# Patient Record
Sex: Female | Born: 1985 | Race: Black or African American | Hispanic: No | Marital: Single | Smoking: Current every day smoker
Health system: Southern US, Community
[De-identification: ages and names within clinical notes are randomized; demographics above are authoritative.]

## PROBLEM LIST (undated history)

## (undated) DIAGNOSIS — F32A Depression, unspecified: Secondary | ICD-10-CM

## (undated) DIAGNOSIS — K5900 Constipation, unspecified: Secondary | ICD-10-CM

## (undated) DIAGNOSIS — F329 Major depressive disorder, single episode, unspecified: Secondary | ICD-10-CM

## (undated) DIAGNOSIS — N83209 Unspecified ovarian cyst, unspecified side: Secondary | ICD-10-CM

## (undated) DIAGNOSIS — F419 Anxiety disorder, unspecified: Secondary | ICD-10-CM

## (undated) HISTORY — DX: Anxiety disorder, unspecified: F41.9

## (undated) HISTORY — DX: Unspecified ovarian cyst, unspecified side: N83.209

## (undated) HISTORY — DX: Depression, unspecified: F32.A

## (undated) HISTORY — DX: Constipation, unspecified: K59.00

## (undated) HISTORY — PX: MANDIBLE FRACTURE SURGERY: SHX706

---
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
## 2011-06-08 ENCOUNTER — Emergency Department: Payer: Self-pay | Admitting: Emergency Medicine

## 2019-02-13 ENCOUNTER — Other Ambulatory Visit: Payer: Self-pay

## 2019-02-13 ENCOUNTER — Emergency Department
Admission: EM | Admit: 2019-02-13 | Discharge: 2019-02-13 | Disposition: A | Discharge status: Home / Self Care | Payer: Medicaid Other | Attending: Emergency Medicine | Admitting: Emergency Medicine

## 2019-02-13 ENCOUNTER — Encounter: Payer: Self-pay | Admitting: Emergency Medicine

## 2019-02-13 ENCOUNTER — Emergency Department: Payer: Medicaid Other

## 2019-02-13 DIAGNOSIS — J029 Acute pharyngitis, unspecified: Secondary | ICD-10-CM | POA: Diagnosis present

## 2019-02-13 DIAGNOSIS — N3000 Acute cystitis without hematuria: Secondary | ICD-10-CM | POA: Diagnosis not present

## 2019-02-13 DIAGNOSIS — J020 Streptococcal pharyngitis: Secondary | ICD-10-CM | POA: Insufficient documentation

## 2019-02-13 DIAGNOSIS — F17200 Nicotine dependence, unspecified, uncomplicated: Secondary | ICD-10-CM | POA: Diagnosis not present

## 2019-02-13 LAB — CBC
HCT: 34.8 % — ABNORMAL LOW (ref 36.0–46.0)
Hemoglobin: 10.9 g/dL — ABNORMAL LOW (ref 12.0–15.0)
MCH: 25.5 pg — ABNORMAL LOW (ref 26.0–34.0)
MCHC: 31.3 g/dL (ref 30.0–36.0)
MCV: 81.5 fL (ref 80.0–100.0)
PLATELETS: 308 10*3/uL (ref 150–400)
RBC: 4.27 MIL/uL (ref 3.87–5.11)
RDW: 17.4 % — ABNORMAL HIGH (ref 11.5–15.5)
WBC: 25.2 10*3/uL — ABNORMAL HIGH (ref 4.0–10.5)
nRBC: 0 % (ref 0.0–0.2)

## 2019-02-13 LAB — COMPREHENSIVE METABOLIC PANEL
ALT: 20 U/L (ref 0–44)
AST: 22 U/L (ref 15–41)
Albumin: 4 g/dL (ref 3.5–5.0)
Alkaline Phosphatase: 78 U/L (ref 38–126)
Anion gap: 9 (ref 5–15)
BUN: 7 mg/dL (ref 6–20)
CO2: 20 mmol/L — AB (ref 22–32)
Calcium: 9.1 mg/dL (ref 8.9–10.3)
Chloride: 108 mmol/L (ref 98–111)
Creatinine, Ser: 0.58 mg/dL (ref 0.44–1.00)
GFR calc Af Amer: >60 mL/min (ref >60)
GFR calc non Af Amer: >60 mL/min (ref >60)
Glucose, Bld: 114 mg/dL — ABNORMAL HIGH (ref 70–99)
Potassium: 3.7 mmol/L (ref 3.5–5.1)
SODIUM: 137 mmol/L (ref 135–145)
Total Bilirubin: 0.6 mg/dL (ref 0.3–1.2)
Total Protein: 8.2 g/dL — ABNORMAL HIGH (ref 6.5–8.1)

## 2019-02-13 LAB — URINALYSIS, COMPLETE (UACMP) WITH MICROSCOPIC
BILIRUBIN URINE: NEGATIVE
Glucose, UA: NEGATIVE mg/dL
Hgb urine dipstick: NEGATIVE
Ketones, ur: NEGATIVE mg/dL
Nitrite: NEGATIVE
Protein, ur: NEGATIVE mg/dL
Specific Gravity, Urine: 1.012 (ref 1.005–1.030)
pH: 6 (ref 5.0–8.0)

## 2019-02-13 LAB — GROUP A STREP BY PCR: Group A Strep by PCR: DETECTED — AB

## 2019-02-13 LAB — POCT PREGNANCY, URINE: Preg Test, Ur: NEGATIVE

## 2019-02-13 LAB — INFLUENZA PANEL BY PCR (TYPE A & B)
INFLAPCR: NEGATIVE
Influenza B By PCR: NEGATIVE

## 2019-02-13 MED ORDER — ACETAMINOPHEN 500 MG PO TABS
1000.0000 mg | ORAL_TABLET | Freq: Once | ORAL | Status: AC
  Administered 2019-02-13: 1000 mg via ORAL
  Filled 2019-02-13: qty 2

## 2019-02-13 MED ORDER — CEPHALEXIN 500 MG PO CAPS: 500.0000 mg | ORAL_CAPSULE | Freq: Four times a day (QID) | ORAL | 0 refills | Status: AC

## 2019-02-13 MED ORDER — MAGIC MOUTHWASH: 15.0000 mL | Freq: Three times a day (TID) | ORAL | 0 refills | Status: DC | PRN

## 2019-02-13 MED ORDER — LIDOCAINE VISCOUS HCL 2 % MT SOLN
15.0000 mL | Freq: Once | OROMUCOSAL | Status: AC
  Administered 2019-02-13: 15 mL via OROMUCOSAL
  Filled 2019-02-13: qty 15

## 2019-02-13 MED ORDER — PENICILLIN G BENZATHINE & PROC 1200000 UNIT/2ML IM SUSP
1.2000 10*6.[IU] | Freq: Once | INTRAMUSCULAR | Status: AC
  Administered 2019-02-13: 1.2 10*6.[IU] via INTRAMUSCULAR
  Filled 2019-02-13: qty 2

## 2019-02-13 MED ORDER — SODIUM CHLORIDE 0.9 % IV BOLUS
1000.0000 mL | Freq: Once | INTRAVENOUS | Status: AC
  Administered 2019-02-13: 1000 mL via INTRAVENOUS

## 2019-02-13 MED ORDER — ONDANSETRON 4 MG PO TBDP: 4.0000 mg | ORAL_TABLET | Freq: Three times a day (TID) | ORAL | 0 refills | Status: DC | PRN

## 2019-02-13 NOTE — ED Notes (Signed)
Pts parents arrived. Dr Sharma Covert cleared patient for visitors. Pts parents provided with masks and gowns and escorted to bedside.

## 2019-02-13 NOTE — ED Triage Notes (Signed)
Pt arrives via ems from with complaints of generalized body aches, fever, cough (for last 3 weeks), headache, and sore throat. Pt states her daughter was tested positive for the influenza "a month ago." Pt denies travel but reports she has been around residents of group home who had a cough. Cough reported to have started 3 weeks prior. However, sore throat, body aches, nausea, and headache started last night. Pt afebrile in triage

## 2019-02-13 NOTE — Discharge Instructions (Signed)
Please make sure you are washing your hands frequently and thoroughly to prevent the spread of infection.  Do not share drinks, or any cutlery.  Take the entire course of antibiotics for your urinary tract infection, even if you are feeling better.  You may continue to use Tylenol or Motrin for pain or fever.  Return to the emergency department if you develop severe pain, lightheadedness or fainting, inability to keep down fluids, or for any other symptoms concerning to you.

## 2019-02-13 NOTE — ED Provider Notes (Signed)
[facility] Emergency Department Provider Note  ____________________________________________  Time seen: Approximately 1:38 PM  I have reviewed the triage vital signs and the nursing notes.   HISTORY  Chief Complaint Generalized Body Aches; Sore Throat; and Headache    HPI Charlotte Hawkins is a 33 y.o. female, otherwise healthy, presenting with sore throat, nausea and vomiting, cough, fever, general malaise.  The patient reports that her young daughter tested positive for influenza several weeks ago and she did not have her influenza vaccination.  She says that for the last 3 weeks, she has been symptomatic with body aches, intermittent subjective fever that is not documented with thermometer, nonproductive cough and sore throat.  She did have an episode of nausea and vomiting that started last night.  Her symptoms have been worse over the last day.  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  History reviewed. No pertinent past medical history.  There are no active problems to display for this patient.   Past Surgical History:  Procedure Laterality Date  . MANDIBLE FRACTURE SURGERY      Current Outpatient Rx  . Order #: [id number] Class: Print  . Order #: [id number] Class: Print  . Order #: [id number] Class: Print    Allergies Patient has no allergy information on record.  No family history on file.  Social History Social History   Tobacco Use  . Smoking status: Current Every Day Smoker  . Smokeless tobacco: Never Used  Substance Use Topics  . Alcohol use: Yes  . Drug use: Not on file    Review of Systems Constitutional: Subjective fever and generalized body aches. Eyes: No visual changes.  No eye discharge. ENT: Positive sore throat. No congestion or rhinorrhea. Cardiovascular: Denies chest pain. Denies palpitations. Respiratory: Denies shortness of breath.  Positive  cough. Gastrointestinal: No abdominal pain.  Positive nausea, positive vomiting.  No diarrhea.  No constipation. Genitourinary: Negative for dysuria. Musculoskeletal: Negative for back pain. Skin: Negative for rash. Neurological: Negative for headaches. No focal numbness, tingling or weakness.     ____________________________________________   PHYSICAL EXAM:  VITAL SIGNS: ED Triage Vitals  Enc Vitals Group     BP 02/13/19 1037 135/72     Pulse Rate 02/13/19 1037 80     Resp 02/13/19 1037 (!) 22     Temp 02/13/19 1037 98.9 F (37.2 C)     Temp Source 02/13/19 1037 Oral     SpO2 02/13/19 1037 100 %     Weight 02/13/19 1038 175 lb (79.4 kg)     Height 02/13/19 1038 5' 2.5" (1.588 m)     Head Circumference --      Peak Flow --      Pain Score 02/13/19 1038 10     Pain Loc --      Pain Edu? --      Excl. in GC? --     Constitutional: Alert and oriented.  Answers questions appropriately.  Uncomfortable appearing and tearful. Eyes: Conjunctivae are normal.  EOMI. No scleral icterus.  No eye discharge. Head: Atraumatic. Nose: Clear rhinorrhea. Mouth/Throat: Mucous membranes are moist.  The patient has mild posterior pharyngeal erythema without tonsillar swelling or exudate.  The posterior palate is symmetric and the uvula is midline.  No hoarse voice, drooling, trismus or stridor. Neck: No stridor.  Supple.  No meningismus. Cardiovascular: Normal rate, regular rhythm. No murmurs, rubs or gallops.  Respiratory: Normal  respiratory effort.  No accessory muscle use or retractions. Lungs CTAB.  No wheezes, rales or ronchi. Gastrointestinal: Soft, nontender and nondistended.  No guarding or rebound.  No peritoneal signs. Musculoskeletal: No LE edema.  Neurologic:  A&Ox3.  Speech is clear.  Face and smile are symmetric.  EOMI.  Moves all extremities well. Skin:  Skin is warm, dry and intact. No rash noted. Psychiatric: Mood and affect are normal. Speech and behavior are normal.  Normal  judgement.  ____________________________________________   LABS (all labs ordered are listed, but only abnormal results are displayed)  Labs Reviewed  GROUP A STREP BY PCR - Abnormal; Notable for the following components:      Result Value   Group A Strep by PCR DETECTED (*)    All other components within normal limits  CBC - Abnormal; Notable for the following components:   WBC 25.2 (*)    Hemoglobin 10.9 (*)    HCT 34.8 (*)    MCH 25.5 (*)    RDW 17.4 (*)    All other components within normal limits  COMPREHENSIVE METABOLIC PANEL - Abnormal; Notable for the following components:   CO2 20 (*)    Glucose, Bld 114 (*)    Total Protein 8.2 (*)    All other components within normal limits  URINALYSIS, COMPLETE (UACMP) WITH MICROSCOPIC - Abnormal; Notable for the following components:   Color, Urine YELLOW (*)    APPearance HAZY (*)    Leukocytes,Ua SMALL (*)    Bacteria, UA RARE (*)    All other components within normal limits  INFLUENZA PANEL BY PCR (TYPE A & B)  POC URINE PREG, ED  POCT PREGNANCY, URINE   ____________________________________________  EKG  Not indicated ____________________________________________  RADIOLOGY  Dg Chest Portable 1 View  Result Date: 02/13/2019 CLINICAL DATA:  Cough EXAM: PORTABLE CHEST 1 VIEW COMPARISON:  None. FINDINGS: Lungs are clear. Heart size and pulmonary vascularity are normal. No adenopathy. No bone lesions. IMPRESSION: No edema or consolidation. Electronically Signed   By: Bretta Bang III M.D.   On: 02/13/2019 11:27    ____________________________________________   PROCEDURES  Procedure(s) performed: None  Procedures  Critical Care performed: Yes, see critical care note(s) ____________________________________________   INITIAL IMPRESSION / ASSESSMENT AND PLAN / ED COURSE  Pertinent labs & imaging results that were available during my care of the patient were reviewed by me and considered in my medical  decision making (see chart for details).  33 y.o., otherwise healthy, presenting with sore throat, fever, cough, nausea and vomiting; works in a group home facility.  Overall, the patient symptoms are concerning for influenza, strep as well given her pharyngeal erythema.  If her testing is negative, will consider coronavirus testing.  Strep test, influenza testing, and chest x-ray as well as basic laboratory studies have been ordered.  The patient will receive symptomatic treatment as well as intravenous fluids.  ----------------------------------------- 1:44 PM on 02/13/2019 -----------------------------------------  The patient's strep test did come back positive and she received intramuscular penicillin.  Her urinalysis does have some squamous cells, but has some evidence of UTI I will plan to treat her with a 5-day course of Keflex for this.  The patient's chest x-ray does not show any pneumonia.  At this time, coronavirus becomes much less likely due to the alternate diagnoses and she does not meet criteria for testing.  The patient is feeling significantly better at this time, and has been able to drink fluids without difficulty.  She will be discharged home.  Follow-up instructions as well as return precautions were discussed.   CRITICAL CARE Performed by: Rockne Menghini   Total critical care time: 30 minutes  Critical care time was exclusive of separately billable procedures and treating other patients.  Critical care was necessary to treat or prevent imminent or life-threatening deterioration.  Critical care was time spent personally by me on the following activities: development of treatment plan with patient and/or surrogate as well as nursing, discussions with consultants, evaluation of patient's response to treatment, examination of patient, obtaining history from patient or surrogate, ordering and performing treatments and interventions, ordering and review of laboratory  studies, ordering and review of radiographic studies, pulse oximetry and re-evaluation of patient's condition.   ____________________________________________  FINAL CLINICAL IMPRESSION(S) / ED DIAGNOSES  Final diagnoses:  Strep pharyngitis  Acute cystitis without hematuria         NEW MEDICATIONS STARTED DURING THIS VISIT:  New Prescriptions   CEPHALEXIN (KEFLEX) 500 MG CAPSULE    Take 1 capsule (500 mg total) by mouth 4 (four) times daily for 5 days.   MAGIC MOUTHWASH SOLN    Take 15 mLs by mouth 3 (three) times daily as needed for mouth pain (sore throat).   ONDANSETRON (ZOFRAN ODT) 4 MG DISINTEGRATING TABLET    Take 1 tablet (4 mg total) by mouth every 8 (eight) hours as needed.      Rockne Menghini, MD 02/13/19 1346

## 2019-07-14 ENCOUNTER — Emergency Department: Payer: Medicaid Other

## 2019-07-14 ENCOUNTER — Emergency Department
Admission: EM | Admit: 2019-07-14 | Discharge: 2019-07-14 | Disposition: A | Discharge status: Home / Self Care | Payer: Medicaid Other | Attending: Emergency Medicine | Admitting: Emergency Medicine

## 2019-07-14 ENCOUNTER — Other Ambulatory Visit: Payer: Self-pay

## 2019-07-14 DIAGNOSIS — R102 Pelvic and perineal pain: Secondary | ICD-10-CM | POA: Diagnosis present

## 2019-07-14 DIAGNOSIS — F17200 Nicotine dependence, unspecified, uncomplicated: Secondary | ICD-10-CM | POA: Diagnosis not present

## 2019-07-14 DIAGNOSIS — N83209 Unspecified ovarian cyst, unspecified side: Secondary | ICD-10-CM

## 2019-07-14 DIAGNOSIS — N83201 Unspecified ovarian cyst, right side: Secondary | ICD-10-CM | POA: Insufficient documentation

## 2019-07-14 LAB — CBC
HCT: 35.6 % — ABNORMAL LOW (ref 36.0–46.0)
Hemoglobin: 11 g/dL — ABNORMAL LOW (ref 12.0–15.0)
MCH: 24.6 pg — ABNORMAL LOW (ref 26.0–34.0)
MCHC: 30.9 g/dL (ref 30.0–36.0)
MCV: 79.5 fL — ABNORMAL LOW (ref 80.0–100.0)
Platelets: 394 10*3/uL (ref 150–400)
RBC: 4.48 MIL/uL (ref 3.87–5.11)
RDW: 17.7 % — ABNORMAL HIGH (ref 11.5–15.5)
WBC: 9.8 10*3/uL (ref 4.0–10.5)
nRBC: 0 % (ref 0.0–0.2)

## 2019-07-14 LAB — COMPREHENSIVE METABOLIC PANEL
ALT: 17 U/L (ref 0–44)
AST: 23 U/L (ref 15–41)
Albumin: 4.6 g/dL (ref 3.5–5.0)
Alkaline Phosphatase: 75 U/L (ref 38–126)
Anion gap: 10 (ref 5–15)
BUN: 5 mg/dL — ABNORMAL LOW (ref 6–20)
CO2: 19 mmol/L — ABNORMAL LOW (ref 22–32)
Calcium: 9.5 mg/dL (ref 8.9–10.3)
Chloride: 107 mmol/L (ref 98–111)
Creatinine, Ser: 0.66 mg/dL (ref 0.44–1.00)
GFR calc Af Amer: >60 mL/min (ref >60)
GFR calc non Af Amer: >60 mL/min (ref >60)
Glucose, Bld: 99 mg/dL (ref 70–99)
Potassium: 3.7 mmol/L (ref 3.5–5.1)
Sodium: 136 mmol/L (ref 135–145)
Total Bilirubin: 0.3 mg/dL (ref 0.3–1.2)
Total Protein: 8.6 g/dL — ABNORMAL HIGH (ref 6.5–8.1)

## 2019-07-14 MED ORDER — NAPROXEN 500 MG PO TABS: 500.0000 mg | ORAL_TABLET | Freq: Two times a day (BID) | ORAL | 0 refills | Status: AC

## 2019-07-14 MED ORDER — ONDANSETRON 4 MG PO TBDP: 4.0000 mg | ORAL_TABLET | Freq: Three times a day (TID) | ORAL | 0 refills | Status: DC | PRN

## 2019-07-14 MED ORDER — HYDROMORPHONE HCL 1 MG/ML IJ SOLN
1.0000 mg | Freq: Once | INTRAMUSCULAR | Status: AC
  Administered 2019-07-14: 1 mg via INTRAVENOUS

## 2019-07-14 MED ORDER — ONDANSETRON HCL 4 MG/2ML IJ SOLN
4.0000 mg | Freq: Once | INTRAMUSCULAR | Status: AC
  Administered 2019-07-14: 4 mg via INTRAVENOUS
  Filled 2019-07-14: qty 2

## 2019-07-14 MED ORDER — HYDROMORPHONE HCL 1 MG/ML IJ SOLN
1.0000 mg | Freq: Once | INTRAMUSCULAR | Status: AC
  Administered 2019-07-14: 05:00:00 1 mg via INTRAVENOUS
  Filled 2019-07-14: qty 1

## 2019-07-14 MED ORDER — SODIUM CHLORIDE 0.9 % IV BOLUS
1000.0000 mL | Freq: Once | INTRAVENOUS | Status: AC
  Administered 2019-07-14: 05:00:00 1000 mL via INTRAVENOUS

## 2019-07-14 MED ORDER — HYDROMORPHONE HCL 1 MG/ML IJ SOLN
INTRAMUSCULAR | Status: AC
  Filled 2019-07-14: qty 1

## 2019-07-14 NOTE — ED Triage Notes (Signed)
Pt states pelvic pain that radiates to left flank that began an hour ago. Pt states does have nausea. Pt appears uncomfortable.

## 2019-07-14 NOTE — ED Provider Notes (Signed)
[facility] Emergency Department Provider Note ____________________________________________   First MD Initiated Contact with Patient 07/14/19 [phone]     (approximate)  I have reviewed the triage vital signs and the nursing notes.   HISTORY  Chief Complaint Pelvic Pain    HPI Charlotte Hawkins is a 33 y.o. female with PMH as noted below as well as a history of ovarian cysts who presents with pelvic pain, acute onset around 1 hour ago, severe in intensity and now radiating to the right flank.  She reports associated nausea but no vomiting.  She denies any vaginal bleeding or discharge.  No past medical history on file.  There are no active problems to display for this patient.   Past Surgical History:  Procedure Laterality Date  . MANDIBLE FRACTURE SURGERY      Prior to Admission medications   Medication Sig Start Date End Date Taking? Authorizing Provider  magic mouthwash SOLN Take 15 mLs by mouth 3 (three) times daily as needed for mouth pain (sore throat). 02/13/19   Rockne MenghiniNorman, Anne-Caroline, MD  ondansetron (ZOFRAN ODT) 4 MG disintegrating tablet Take 1 tablet (4 mg total) by mouth every 8 (eight) hours as needed. 02/13/19   Rockne MenghiniNorman, Anne-Caroline, MD    Allergies Pepto-bismol [bismuth subsalicylate]  No family history on file.  Social History Social History   Tobacco Use  . Smoking status: Current Every Day Smoker  . Smokeless tobacco: Never Used  Substance Use Topics  . Alcohol use: Yes  . Drug use: Not on file    Review of Systems  Constitutional: No fever. Eyes: No redness. ENT: No sore throat. Cardiovascular: Denies chest pain. Respiratory: Denies shortness of breath. Gastrointestinal: No vomiting or diarrhea.  Genitourinary: Negative for vaginal bleeding or discharge.  Musculoskeletal: Negative for back pain. Skin: Negative for rash. Neurological: Negative for headache.   ____________________________________________   PHYSICAL  EXAM:  VITAL SIGNS: ED Triage Vitals  Enc Vitals Group     BP 07/14/19 0428 (!) 153/87     Pulse Rate 07/14/19 0428 (!) 118     Resp 07/14/19 0428 (!) 22     Temp 07/14/19 0428 98.1 F (36.7 C)     Temp Source 07/14/19 0428 Oral     SpO2 07/14/19 0428 100 %     Weight --      Height 07/14/19 0429 5\' 2"  (1.575 m)     Head Circumference --      Peak Flow --      Pain Score 07/14/19 0429 10     Pain Loc --      Pain Edu? --      Excl. in GC? --     Constitutional: Alert and oriented.  Very uncomfortable appearing. Eyes: Conjunctivae are normal.  No scleral icterus. Head: Atraumatic. Nose: No congestion/rhinnorhea. Mouth/Throat: Mucous membranes are moist.   Neck: Normal range of motion.  Cardiovascular: Good peripheral circulation. Respiratory: Normal respiratory effort.  No retractions.  Gastrointestinal: Soft with moderate suprapubic tenderness.  No distention.  Genitourinary: Normal external genitalia.  Moderate right adnexal tenderness. Musculoskeletal:  Extremities warm and well perfused.  Neurologic:  Normal speech and language. No gross focal neurologic deficits are appreciated.  Skin:  Skin is warm and dry. No rash noted. Psychiatric: Mood and affect are normal. Speech and behavior are normal.  ____________________________________________   LABS (all labs ordered are listed, but only abnormal results are displayed)  Labs Reviewed  CBC - Abnormal; Notable for the following components:  Result Value   Hemoglobin 11.0 (*)    HCT 35.6 (*)    MCV 79.5 (*)    MCH 24.6 (*)    RDW 17.7 (*)    All other components within normal limits  COMPREHENSIVE METABOLIC PANEL - Abnormal; Notable for the following components:   CO2 19 (*)    BUN 5 (*)    Total Protein 8.6 (*)    All other components within normal limits  URINALYSIS, COMPLETE (UACMP) WITH MICROSCOPIC  POC URINE PREG, ED   ____________________________________________  EKG    ____________________________________________  RADIOLOGY  US pelvis transvaginal: Right complex ovarian cyst likely hemorrhagic  ____________________________________________   PROCEDURES  Procedure(s) performed: No  Procedures  Critical Care performed: No ____________________________________________   INITIAL IMPRESSION / ASSESSMENT AND PLAN / ED COURSE  Pertinent labs & imaging results that were available during my care of the patient were reviewed by me and considered in my medical decision making (see chart for details).  33 year old female with PMH as noted above presents with acute onset of pelvic pain radiating to the right flank.  On exam, she is extremely uncomfortable appearing.  Abdomen is soft with moderate suprapubic tenderness and on pelvic exam there is no discharge but the patient does have significant right adnexal tenderness.  There is no palpable mass.  Overall presentation is most concerning for ruptured ovarian cyst versus possible torsion.  I consulted Dr. Amalia Hailey from OB/GYN who recommended that we proceed with ultrasound to differentiate between these etiologies and that he would not recommend any acute intervention until we see the ultrasound result.  The patient is somewhat more comfortable after 2 doses of Dilaudid.  We will obtain labs, ultrasound, and reassess.  ----------------------------------------- 7:09 AM on 07/14/2019 -----------------------------------------  Ultrasound shows a right ovarian cyst with septations and likely hemorrhagic.  There is no evidence of rupture and there is no significant free fluid in the pelvis.  On reassessment, the patient appears comfortable.  I would like to observe her for an additional 1 to 2 hours just to make sure that the severe pain she came in with does not return once the Dilaudid wears off.  If her pain remains under control she will be appropriate for discharge home.  I discussed the ultrasound results  with Dr. Amalia Hailey who recommends outpatient follow-up in the next 1 to 2 weeks.  I signed the patient out to the oncoming physician Dr. Charna Archer.  ____________________________________________   FINAL CLINICAL IMPRESSION(S) / ED DIAGNOSES  Final diagnoses:  Pelvic pain      NEW MEDICATIONS STARTED DURING THIS VISIT:  New Prescriptions   No medications on file     Note:  This document was prepared using Dragon voice recognition software and may include unintentional dictation errors.    Arta Silence, MD 07/14/19 [phone]

## 2019-07-14 NOTE — ED Provider Notes (Signed)
-----------------------------------------   7:05 AM on 07/14/2019 -----------------------------------------  Blood pressure 109/63, pulse 75, temperature 98.1 F (36.7 C), temperature source Oral, resp. rate 20, height 5\' 2"  (1.575 m), last menstrual period 06/03/2019, SpO2 98 %.  Assuming care from Dr. Cherylann Banas.  In short, Charlotte Hawkins is a 33 y.o. female with a chief complaint of Pelvic Pain .  Refer to the original H&P for additional details.  The current plan of care is to reevaluate to determine appropriate pain control.  Patient with hemorrhagic cyst and no evidence of torsion per ultrasound, discussed with OB by previous provider and plan for outpatient follow-up.  Patient reports she is "feeling much better".  Will have patient follow-up with OB/GYN as previously arranged, provided with pain medication and Zofran to use as needed.  Counseled to return to the ED for new or worsening symptoms, patient agrees with plan.    Blake Divine, MD 07/14/19 [phone]

## 2019-07-17 ENCOUNTER — Other Ambulatory Visit: Payer: Self-pay

## 2019-07-17 ENCOUNTER — Ambulatory Visit (INDEPENDENT_AMBULATORY_CARE_PROVIDER_SITE_OTHER): Payer: Medicaid Other | Admitting: Obstetrics and Gynecology

## 2019-07-17 ENCOUNTER — Encounter: Payer: Self-pay | Admitting: Obstetrics and Gynecology

## 2019-07-17 VITALS — BP 116/74 | HR 73 | Ht 62.0 in | Wt 196.0 lb

## 2019-07-17 DIAGNOSIS — N83201 Unspecified ovarian cyst, right side: Secondary | ICD-10-CM

## 2019-07-17 DIAGNOSIS — R102 Pelvic and perineal pain: Secondary | ICD-10-CM | POA: Diagnosis not present

## 2019-07-17 DIAGNOSIS — Z3009 Encounter for other general counseling and advice on contraception: Secondary | ICD-10-CM | POA: Diagnosis not present

## 2019-07-17 NOTE — Progress Notes (Signed)
HPI:      [name] is a 33 y.o. [phone] [id number] who LMP was Patient's last menstrual period was 07/17/2019.  Subjective:   She presents today after being seen in the emergency department for pelvic pain.  She was found to have a right hemorrhagic ovarian cyst.  She is not currently using anything for birth control but would like some birth control. She describes a history of 4 pregnancies with all preterm births-all babies are living. She also complains of heavy painful menstrual periods that she would like controlled with birth control. Her pain has significantly improved since her ED visit.    Hx: The following portions of the patient's history were reviewed and updated as appropriate:             She  has a past medical history of Anxiety, Constipation, Depression, and Ovarian cyst. She does not have a problem list on file. She  has a past surgical history that includes Mandible fracture surgery. Her family history includes Heart disease in her mother. She  reports that she has been smoking. She has never used smokeless tobacco. She reports current alcohol use. She reports current drug use. Frequency: 3.00 times per week. Drug: Marijuana. She has a current medication list which includes the following prescription(s): docusate sodium, fluoxetine, gabapentin, naproxen, ondansetron, and multivitamin-prenatal. She is allergic to bee venom and pepto-bismol [bismuth subsalicylate].       Review of Systems:  Review of Systems  Constitutional: Denied constitutional symptoms, night sweats, recent illness, fatigue, fever, insomnia and weight loss.  Eyes: Denied eye symptoms, eye pain, photophobia, vision change and visual disturbance.  Ears/Nose/Throat/Neck: Denied ear, nose, throat or neck symptoms, hearing loss, nasal discharge, sinus congestion and sore throat.  Cardiovascular: Denied cardiovascular symptoms, arrhythmia, chest pain/pressure, edema, exercise intolerance, orthopnea and  palpitations.  Respiratory: Denied pulmonary symptoms, asthma, pleuritic pain, productive sputum, cough, dyspnea and wheezing.  Gastrointestinal: Denied, gastro-esophageal reflux, melena, nausea and vomiting.  Genitourinary: See HPI for additional information.  Musculoskeletal: Denied musculoskeletal symptoms, stiffness, swelling, muscle weakness and myalgia.  Dermatologic: Denied dermatology symptoms, rash and scar.  Neurologic: Denied neurology symptoms, dizziness, headache, neck pain and syncope.  Psychiatric: Denied psychiatric symptoms, anxiety and depression.  Endocrine: Denied endocrine symptoms including hot flashes and night sweats.   Meds:   Current Outpatient Medications on File Prior to Visit  Medication Sig Dispense Refill  . docusate sodium (COLACE) 100 MG capsule Take 100 mg by mouth every other day.    Marland Kitchen. FLUoxetine (PROZAC) 20 MG tablet Take 20 mg by mouth 2 (two) times daily.    Marland Kitchen. gabapentin (NEURONTIN) 300 MG capsule Take 300 mg by mouth at bedtime.     . naproxen (NAPROSYN) 500 MG tablet Take 1 tablet (500 mg total) by mouth 2 (two) times daily with a meal for 6 days. 12 tablet 0  . ondansetron (ZOFRAN ODT) 4 MG disintegrating tablet Take 1 tablet (4 mg total) by mouth every 8 (eight) hours as needed for nausea or vomiting. 12 tablet 0  . Prenatal Vit-Fe Fumarate-FA (MULTIVITAMIN-PRENATAL) 27-0.8 MG TABS tablet Take 1 tablet by mouth daily at 12 noon.     No current facility-administered medications on file prior to visit.     Objective:     Vitals:   07/17/19 0940  BP: 116/74  Pulse: 73              Ultrasound findings and ED visit reviewed in detail with the patient.  Assessment:  [id number] There are no active problems to display for this patient.    1. Pelvic pain in female   2. Cyst of right ovary   3. Birth control counseling     Pain is significantly improving daily.   Plan:            1.  Continued use of NSAID S for pain relief.  2.   Follow-up ultrasound in 6 weeks  3.  Patient desires birth control and cycle control.  We have discussed multiple birth control methods.  The risk and benefits of each method was discussed in detail.  Patient would like to present for IUD insertion with her next menstrual period.  Orders Orders Placed This Encounter  Procedures  . US PELVIS (TRANSABDOMINAL ONLY)    No orders of the defined types were placed in this encounter.     F/U  Return for She is to call at the start of next menses. I spent 32 minutes involved in the care of this patient of which greater than 50% was spent discussing ED visit, hemorrhagic versus simple cyst, cyst follow-up, causes of hemorrhagic cyst, multiple methods of birth control risks and benefits.  All questions answered.  Finis Bud, M.D. 07/17/2019 10:19 AM

## 2019-07-18 ENCOUNTER — Ambulatory Visit (INDEPENDENT_AMBULATORY_CARE_PROVIDER_SITE_OTHER): Payer: Medicaid Other | Admitting: Obstetrics and Gynecology

## 2019-07-18 ENCOUNTER — Encounter: Payer: Self-pay | Admitting: Certified Nurse Midwife

## 2019-07-18 ENCOUNTER — Encounter: Payer: Self-pay | Admitting: Obstetrics and Gynecology

## 2019-07-18 VITALS — BP 113/75 | HR 71 | Ht 62.0 in | Wt 195.9 lb

## 2019-07-18 DIAGNOSIS — Z30430 Encounter for insertion of intrauterine contraceptive device: Secondary | ICD-10-CM

## 2019-07-18 DIAGNOSIS — Z3202 Encounter for pregnancy test, result negative: Secondary | ICD-10-CM | POA: Diagnosis not present

## 2019-07-18 LAB — POCT URINE PREGNANCY: Preg Test, Ur: NEGATIVE

## 2019-07-18 NOTE — Progress Notes (Signed)
HPI:      [name] is a 33 y.o. [phone] who LMP was Patient's last menstrual period was 07/17/2019.  Subjective:   She presents today for IUD insertion.  She would like an IUD for birth control.  She was recently seen in the emergency department for small hemorrhagic ovarian cyst which will be followed up by ultrasound in 6 weeks.  She says she has tried OCPs before and she did not do well taking them. She she started her period yesterday.    Hx: The following portions of the patient's history were reviewed and updated as appropriate:             She  has a past medical history of Anxiety, Constipation, Depression, and Ovarian cyst. She does not have a problem list on file. She  has a past surgical history that includes Mandible fracture surgery. Her family history includes Heart disease in her mother. She  reports that she has been smoking. She has never used smokeless tobacco. She reports current alcohol use. She reports current drug use. Frequency: 3.00 times per week. Drug: Marijuana. She has a current medication list which includes the following prescription(s): docusate sodium, fluoxetine, gabapentin, naproxen, ondansetron, and multivitamin-prenatal. She is allergic to bee venom and pepto-bismol [bismuth subsalicylate].       Review of Systems:  Review of Systems  Constitutional: Denied constitutional symptoms, night sweats, recent illness, fatigue, fever, insomnia and weight loss.  Eyes: Denied eye symptoms, eye pain, photophobia, vision change and visual disturbance.  Ears/Nose/Throat/Neck: Denied ear, nose, throat or neck symptoms, hearing loss, nasal discharge, sinus congestion and sore throat.  Cardiovascular: Denied cardiovascular symptoms, arrhythmia, chest pain/pressure, edema, exercise intolerance, orthopnea and palpitations.  Respiratory: Denied pulmonary symptoms, asthma, pleuritic pain, productive sputum, cough, dyspnea and wheezing.  Gastrointestinal: Denied,  gastro-esophageal reflux, melena, nausea and vomiting.  Genitourinary: Denied genitourinary symptoms including symptomatic vaginal discharge, pelvic relaxation issues, and urinary complaints.  Musculoskeletal: Denied musculoskeletal symptoms, stiffness, swelling, muscle weakness and myalgia.  Dermatologic: Denied dermatology symptoms, rash and scar.  Neurologic: Denied neurology symptoms, dizziness, headache, neck pain and syncope.  Psychiatric: Denied psychiatric symptoms, anxiety and depression.  Endocrine: Denied endocrine symptoms including hot flashes and night sweats.   Meds:   Current Outpatient Medications on File Prior to Visit  Medication Sig Dispense Refill  . docusate sodium (COLACE) 100 MG capsule Take 100 mg by mouth every other day.    Marland Kitchen. FLUoxetine (PROZAC) 20 MG tablet Take 20 mg by mouth 2 (two) times daily.    Marland Kitchen. gabapentin (NEURONTIN) 300 MG capsule Take 300 mg by mouth at bedtime.     . naproxen (NAPROSYN) 500 MG tablet Take 1 tablet (500 mg total) by mouth 2 (two) times daily with a meal for 6 days. 12 tablet 0  . ondansetron (ZOFRAN ODT) 4 MG disintegrating tablet Take 1 tablet (4 mg total) by mouth every 8 (eight) hours as needed for nausea or vomiting. 12 tablet 0  . Prenatal Vit-Fe Fumarate-FA (MULTIVITAMIN-PRENATAL) 27-0.8 MG TABS tablet Take 1 tablet by mouth daily at 12 noon.     No current facility-administered medications on file prior to visit.     Objective:     Vitals:   07/18/19 1516  BP: 113/75  Pulse: 71    Physical examination   Pelvic:   Vulva: Normal appearance.  No lesions.  Vagina: No lesions or abnormalities noted.  Support: Normal pelvic support.  Urethra No masses tenderness or scarring.  Meatus  Normal size without lesions or prolapse.  Cervix: Normal appearance.  No lesions.  Anus: Normal exam.  No lesions.  Perineum: Normal exam.  No lesions.        Bimanual   Uterus: Normal size.  Non-tender.  Mobile.  AV.  Adnexae: No masses.   Non-tender to palpation.  Cul-de-sac: Negative for abnormality.   IUD Procedure Pt has read the booklet and signed the appropriate forms regarding the Mirena IUD.  All of her questions have been answered.   The cervix was cleansed with betadine solution.  After sounding the uterus and noting the position, the IUD was placed in the usual manner without problem.  The string was cut to the appropriate length.  The patient tolerated the procedure well.             Assessment:    [id number] There are no active problems to display for this patient.    1. Encounter for insertion of mirena IUD       Plan:             F/U  Return in about 4 weeks (around 08/15/2019) for For IUD f/u.  Finis Bud, M.D. 07/18/2019 3:54 PM

## 2019-07-20 ENCOUNTER — Telehealth: Payer: Self-pay | Admitting: Obstetrics and Gynecology

## 2019-07-20 NOTE — Telephone Encounter (Signed)
Patient stated that she is having pain on the right side. She is taking aleve for pain. I let the patient that Dr. Amalia Hailey is not in the office until Tuesday. I told her she can try heating pad to help with the cramping. I have scheduled her to come sin next week.

## 2019-07-20 NOTE — Telephone Encounter (Signed)
Pt called and stated that she needs to speak with a nurse as soon as possible in regards to her having a severe amount of pain on one side after her appointment with Dr. Amalia Hailey this week. Please advise.

## 2019-07-22 ENCOUNTER — Emergency Department: Payer: Medicaid Other

## 2019-07-22 ENCOUNTER — Emergency Department
Admission: EM | Admit: 2019-07-22 | Discharge: 2019-07-22 | Disposition: A | Discharge status: Home / Self Care | Payer: Medicaid Other | Attending: Emergency Medicine | Admitting: Emergency Medicine

## 2019-07-22 ENCOUNTER — Other Ambulatory Visit: Payer: Self-pay

## 2019-07-22 DIAGNOSIS — N83201 Unspecified ovarian cyst, right side: Secondary | ICD-10-CM | POA: Insufficient documentation

## 2019-07-22 DIAGNOSIS — R1031 Right lower quadrant pain: Secondary | ICD-10-CM | POA: Diagnosis present

## 2019-07-22 DIAGNOSIS — Z79899 Other long term (current) drug therapy: Secondary | ICD-10-CM | POA: Diagnosis not present

## 2019-07-22 DIAGNOSIS — F17200 Nicotine dependence, unspecified, uncomplicated: Secondary | ICD-10-CM | POA: Insufficient documentation

## 2019-07-22 LAB — URINALYSIS, COMPLETE (UACMP) WITH MICROSCOPIC
Bacteria, UA: NONE SEEN
Bilirubin Urine: NEGATIVE
Glucose, UA: NEGATIVE mg/dL
Ketones, ur: NEGATIVE mg/dL
Nitrite: NEGATIVE
Protein, ur: NEGATIVE mg/dL
RBC / HPF: >50 RBC/hpf — ABNORMAL HIGH (ref 0–5)
Specific Gravity, Urine: 1.016 (ref 1.005–1.030)
Squamous Epithelial / HPF: NONE SEEN (ref 0–5)
pH: 5 (ref 5.0–8.0)

## 2019-07-22 LAB — COMPREHENSIVE METABOLIC PANEL
ALT: 16 U/L (ref 0–44)
AST: 22 U/L (ref 15–41)
Albumin: 3.9 g/dL (ref 3.5–5.0)
Alkaline Phosphatase: 72 U/L (ref 38–126)
Anion gap: 9 (ref 5–15)
BUN: 5 mg/dL — ABNORMAL LOW (ref 6–20)
CO2: 22 mmol/L (ref 22–32)
Calcium: 8.7 mg/dL — ABNORMAL LOW (ref 8.9–10.3)
Chloride: 105 mmol/L (ref 98–111)
Creatinine, Ser: 0.72 mg/dL (ref 0.44–1.00)
GFR calc Af Amer: >60 mL/min (ref >60)
GFR calc non Af Amer: >60 mL/min (ref >60)
Glucose, Bld: 86 mg/dL (ref 70–99)
Potassium: 4.1 mmol/L (ref 3.5–5.1)
Sodium: 136 mmol/L (ref 135–145)
Total Bilirubin: 0.3 mg/dL (ref 0.3–1.2)
Total Protein: 7.2 g/dL (ref 6.5–8.1)

## 2019-07-22 LAB — CBC
HCT: 34.2 % — ABNORMAL LOW (ref 36.0–46.0)
Hemoglobin: 10.2 g/dL — ABNORMAL LOW (ref 12.0–15.0)
MCH: 24.3 pg — ABNORMAL LOW (ref 26.0–34.0)
MCHC: 29.8 g/dL — ABNORMAL LOW (ref 30.0–36.0)
MCV: 81.4 fL (ref 80.0–100.0)
Platelets: 332 10*3/uL (ref 150–400)
RBC: 4.2 MIL/uL (ref 3.87–5.11)
RDW: 17.7 % — ABNORMAL HIGH (ref 11.5–15.5)
WBC: 8.4 10*3/uL (ref 4.0–10.5)
nRBC: 0 % (ref 0.0–0.2)

## 2019-07-22 LAB — LIPASE, BLOOD: Lipase: 18 U/L (ref 11–51)

## 2019-07-22 LAB — PREGNANCY, URINE: Preg Test, Ur: NEGATIVE

## 2019-07-22 MED ORDER — TRAMADOL HCL 50 MG PO TABS: 50.0000 mg | ORAL_TABLET | Freq: Four times a day (QID) | ORAL | 0 refills | Status: AC | PRN

## 2019-07-22 MED ORDER — IOHEXOL 240 MG/ML SOLN
50.0000 mL | Freq: Once | INTRAMUSCULAR | Status: AC | PRN
  Administered 2019-07-22: 50 mL via ORAL

## 2019-07-22 MED ORDER — IOHEXOL 300 MG/ML  SOLN
100.0000 mL | Freq: Once | INTRAMUSCULAR | Status: AC | PRN
  Administered 2019-07-22: 19:00:00 100 mL via INTRAVENOUS

## 2019-07-22 MED ORDER — ONDANSETRON HCL 4 MG/2ML IJ SOLN
4.0000 mg | Freq: Once | INTRAMUSCULAR | Status: AC
  Administered 2019-07-22: 4 mg via INTRAVENOUS
  Filled 2019-07-22: qty 2

## 2019-07-22 MED ORDER — MORPHINE SULFATE (PF) 4 MG/ML IV SOLN
4.0000 mg | Freq: Once | INTRAVENOUS | Status: AC
  Administered 2019-07-22: 4 mg via INTRAVENOUS
  Filled 2019-07-22: qty 1

## 2019-07-22 NOTE — ED Notes (Signed)
Pt taken to CT via stretcher.

## 2019-07-22 NOTE — ED Notes (Signed)
Lab notified to add on pregnancy test. 

## 2019-07-22 NOTE — ED Provider Notes (Signed)
[facility] Emergency Department Provider Note ____________________________________________   First MD Initiated Contact with Patient 07/22/19 1703     (approximate)  I have reviewed the triage vital signs and the nursing notes.   HISTORY  Chief Complaint Abdominal Pain    HPI Charlotte Hawkins is a 33 y.o. female with PMH as noted below including a recent diagnosis of an ovarian cyst who presents with persistent right lower abdominal/pelvic pain over the last week.  The patient was diagnosed with a hemorrhagic ovarian cyst and subsequently followed up with OB/GYN.  IUD was placed several days ago.  The patient states that the pain had significantly improved after her ER and OB/GYN visit, but then worsened again over the last 2 days.  She reports nausea but no vomiting.  She has no fever.  She denies any vaginal discharge or dysuria, but has had some bleeding since the IUD was placed.  Past Medical History:  Diagnosis Date  . Anxiety   . Constipation   . Depression   . Ovarian cyst     There are no active problems to display for this patient.   Past Surgical History:  Procedure Laterality Date  . MANDIBLE FRACTURE SURGERY      Prior to Admission medications   Medication Sig Start Date End Date Taking? Authorizing Provider  docusate sodium (COLACE) 100 MG capsule Take 100 mg by mouth every other day.    [provider]  FLUoxetine (PROZAC) 20 MG tablet Take 20 mg by mouth 2 (two) times daily. 10/19/18   [provider]  gabapentin (NEURONTIN) 300 MG capsule Take 300 mg by mouth at bedtime.  12/31/15   [provider]  ondansetron (ZOFRAN ODT) 4 MG disintegrating tablet Take 1 tablet (4 mg total) by mouth every 8 (eight) hours as needed for nausea or vomiting. 07/14/19   Chesley NoonJessup, Charles, MD  Prenatal Vit-Fe Fumarate-FA (MULTIVITAMIN-PRENATAL) 27-0.8 MG TABS tablet Take 1 tablet by mouth daily at 12 noon.    [provider]   traMADol (ULTRAM) 50 MG tablet Take 1 tablet (50 mg total) by mouth every 6 (six) hours as needed for up to 5 days for severe pain. 07/22/19 07/27/19  Dionne BucySiadecki, Lanyah Spengler, MD    Allergies Bee venom and Pepto-bismol [bismuth subsalicylate]  Family History  Problem Relation Age of Onset  . Heart disease Mother   . Breast cancer Neg Hx   . Ovarian cancer Neg Hx   . Colon cancer Neg Hx   . Diabetes Neg Hx     Social History Social History   Tobacco Use  . Smoking status: Current Every Day Smoker  . Smokeless tobacco: Never Used  Substance Use Topics  . Alcohol use: Yes    Comment: rare  . Drug use: Yes    Frequency: 3.0 times per week    Types: Marijuana    Review of Systems  Constitutional: No fever. Eyes: No redness. ENT: No sore throat. Cardiovascular: Denies chest pain. Respiratory: Denies shortness of breath. Gastrointestinal: Positive for nausea Genitourinary: Negative for dysuria.   Musculoskeletal: Negative for back pain. Skin: Negative for rash. Neurological: Negative for headache.   ____________________________________________   PHYSICAL EXAM:  VITAL SIGNS: ED Triage Vitals  Enc Vitals Group     BP 07/22/19 1551 (!) 109/52     Pulse Rate 07/22/19 1551 68     Resp 07/22/19 1551 14     Temp 07/22/19 1551 98.6 F (37 C)     Temp  Source 07/22/19 1551 Oral     SpO2 07/22/19 1551 98 %     Weight 07/22/19 1552 193 lb (87.5 kg)     Height 07/22/19 1707 5\' 2"  (1.575 m)     Head Circumference --      Peak Flow --      Pain Score 07/22/19 1551 10     Pain Loc --      Pain Edu? --      Excl. in GC? --     Constitutional: Alert and oriented.  Slightly uncomfortable appearing but in no acute distress. Eyes: Conjunctivae are normal.  Head: Atraumatic. Nose: No congestion/rhinnorhea. Mouth/Throat: Mucous membranes are moist.   Neck: Normal range of motion.  Cardiovascular: Good peripheral circulation. Respiratory: Normal respiratory effort.  No  retractions.  Gastrointestinal: Soft with moderate right lower quadrant tenderness.  No distention.  Genitourinary: No flank tenderness. Musculoskeletal: Extremities warm and well perfused.  Neurologic:  Normal speech and language. No gross focal neurologic deficits are appreciated.  Skin:  Skin is warm and dry. No rash noted. Psychiatric: Mood and affect are normal. Speech and behavior are normal.  ____________________________________________   LABS (all labs ordered are listed, but only abnormal results are displayed)  Labs Reviewed  COMPREHENSIVE METABOLIC PANEL - Abnormal; Notable for the following components:      Result Value   BUN 5 (*)    Calcium 8.7 (*)    All other components within normal limits  CBC - Abnormal; Notable for the following components:   Hemoglobin 10.2 (*)    HCT 34.2 (*)    MCH 24.3 (*)    MCHC 29.8 (*)    RDW 17.7 (*)    All other components within normal limits  URINALYSIS, COMPLETE (UACMP) WITH MICROSCOPIC - Abnormal; Notable for the following components:   Color, Urine YELLOW (*)    APPearance CLEAR (*)    Hgb urine dipstick LARGE (*)    Leukocytes,Ua TRACE (*)    RBC / HPF >50 (*)    All other components within normal limits  LIPASE, BLOOD  PREGNANCY, URINE   ____________________________________________  EKG   ____________________________________________  RADIOLOGY  CT abdomen: Right ovarian cyst similar appearance to prior ultrasound, with normal appendix and no other acute abnormality  ____________________________________________   PROCEDURES  Procedure(s) performed: No  Procedures  Critical Care performed: No ____________________________________________   INITIAL IMPRESSION / ASSESSMENT AND PLAN / ED COURSE  Pertinent labs & imaging results that were available during my care of the patient were reviewed by me and considered in my medical decision making (see chart for details).  33 year old female with PMH as noted  above presents with persistent right lower abdominal pain over the last week.  I reviewed the past medical records in epic.  I actually saw this patient in the ED on 07/14/2019 at which time she was diagnosed by ultrasound with a hemorrhagic right ovarian cyst.  She subsequently followed up with Dr. Logan BoresEvans from OB/GYN on 8/18 and 07/18/2019 and had an IUD placed.  On exam today the patient is uncomfortable but overall relatively well-appearing.  Her vital signs are normal.  Abdomen soft but she does have right lower quadrant tenderness.  Overall I suspect most likely persistent pain related to the hemorrhagic ovarian cyst as she states that the pain has been in the same location the whole time.  However given that she has been initiated on birth control and initially had improvement but then worsening, I would like  to broaden the work-up outside of gynecologic etiologies.  I will obtain a CT to rule out appendicitis, colitis, significant free fluid, or other acute abnormality.  If there are no significant findings I will discuss with OB/GYN about further management.  ----------------------------------------- 11:11 PM on 07/22/2019 -----------------------------------------  CT abdomen shows no acute abnormality.  I consulted Dr. Marcelline Mates from OB/GYN who recommended that the patient follow-up with Dr. Amalia Hailey this week, but advised that there was no intervention needed for now other than pain control.  The patient has an appointment with Dr. Ellard Artis set for Wednesday of this week.  On reassessment, she was comfortable appearing.  Her vital signs remained stable.  I counseled the patient on the results of the work-up.  There is no evidence of acute complication related to the ovarian cyst, and no evidence of other acute intra-abdominal process.  I advised that we would give some additional pain medication and recommended that she follow-up with Dr. Amalia Hailey this week as planned.  Return precautions given, and the  patient expressed understanding. ____________________________________________   FINAL CLINICAL IMPRESSION(S) / ED DIAGNOSES  Final diagnoses:  Cyst of right ovary      NEW MEDICATIONS STARTED DURING THIS VISIT:  Discharge Medication List as of 07/22/2019  9:29 PM    START taking these medications   Details  traMADol (ULTRAM) 50 MG tablet Take 1 tablet (50 mg total) by mouth every 6 (six) hours as needed for up to 5 days for severe pain., Starting Sun 07/22/2019, Until Fri 07/27/2019, Normal         Note:  This document was prepared using Dragon voice recognition software and may include unintentional dictation errors.    Arta Silence, MD 07/22/19 2325

## 2019-07-22 NOTE — ED Notes (Signed)
CT notified that pt is finished with oral contrast. 

## 2019-07-22 NOTE — ED Triage Notes (Signed)
Pt presents via POV c/o RLQ pain x1 weeks. Seen here last Saturday and d/c. Reports same symptoms but worsening. Pt reports following-up with OBGYN as instructed.

## 2019-07-22 NOTE — ED Notes (Signed)
Peripheral IV discontinued. Catheter intact. No signs of infiltration or redness. Gauze applied to IV site.   Discharge instructions reviewed with patient. Questions fielded by this RN. Patient verbalizes understanding of instructions. Patient discharged home in stable condition per siadecki. No acute distress noted at time of discharge.   Pt wheeled to parking lot for mother pu

## 2019-07-22 NOTE — ED Notes (Signed)
Pt c/o RLQ abd pain x 1 week, pt states she was here recently for the same pain, had [place] and followed up with GYN re: ovary. Pt had mirena IUD placed recently as well. Pt states since then the pain has increased and is constant. PT is tearful. Reports nausea and has been taking zofran at home this week.  Pt reports some vaginal bleeding as well.

## 2019-07-22 NOTE — Discharge Instructions (Addendum)
Continue Aleve or Naprosyn for the pain, with the tramadol prescribed today as needed for more severe pain.  Follow-up with Dr. Amalia Hailey on Wednesday as scheduled.  Return to the ER for new, worsening, or persistent severe abdominal pain, vomiting, fever, weakness, worsening vaginal bleeding, or any other new or worsening symptoms that concern you.

## 2019-07-22 NOTE — ED Notes (Signed)
Pt ambulatory to toilet with steady gait noted.  

## 2019-07-24 NOTE — Telephone Encounter (Signed)
Coronavirus (COVID-19) Are you at risk?  Are you at risk for the Coronavirus (COVID-19)?  To be considered HIGH RISK for Coronavirus (COVID-19), you have to meet the following criteria:  . Traveled to [place], [place], [place], [place] or [place]; or in the [place] to [place], [place], [place], or [place]; and have fever, cough, and shortness of breath within the last 2 weeks of travel OR . Been in close contact with a person diagnosed with COVID-19 within the last 2 weeks and have fever, cough, and shortness of breath . IF YOU DO NOT MEET THESE CRITERIA, YOU ARE CONSIDERED LOW RISK FOR COVID-19.  What to do if you are HIGH RISK for COVID-19?  . If you are having a medical emergency, call 911. . Seek medical care right away. Before you go to a doctor's office, urgent care or emergency department, call ahead and tell them about your recent travel, contact with someone diagnosed with COVID-19, and your symptoms. You should receive instructions from your physician's office regarding next steps of care.  . When you arrive at healthcare provider, tell the healthcare staff immediately you have returned from visiting [place], [place], [place], [place] or [place]; or traveled in the [place] to [place], [place], [place], or [place]; in the last two weeks or you have been in close contact with a person diagnosed with COVID-19 in the last 2 weeks.   . Tell the health care staff about your symptoms: fever, cough and shortness of breath. . After you have been seen by a medical provider, you will be either: o Tested for (COVID-19) and discharged home on quarantine except to seek medical care if symptoms worsen, and asked to  - Stay home and avoid contact with others until you get your results (4-5 days)  - Avoid travel on public transportation if possible (such as bus, train, or airplane) or o Sent to the Emergency Department by EMS for evaluation, COVID-19 testing, and possible  admission depending on your condition and test results.  What to do if you are LOW RISK for COVID-19?  Reduce your risk of any infection by using the same precautions used for avoiding the common cold or flu:  . Wash your hands often with soap and warm water for at least 20 seconds.  If soap and water are not readily available, use an alcohol-based hand sanitizer with at least 60% alcohol.  . If coughing or sneezing, cover your mouth and nose by coughing or sneezing into the elbow areas of your shirt or coat, into a tissue or into your sleeve (not your hands). . Avoid shaking hands with others and consider head nods or verbal greetings only. . Avoid touching your eyes, nose, or mouth with unwashed hands.  . Avoid close contact with people who are [place]. . Avoid places or events with large numbers of people in one location, like concerts or sporting events. . Carefully consider travel plans you have or are making. . If you are planning any travel outside or inside the [place], visit the CDC's Travelers' Health webpage for the latest health notices. . If you have some symptoms but not all symptoms, continue to monitor at home and seek medical attention if your symptoms worsen. . If you are having a medical emergency, call 911.  07/24/19 SCREENING NEG SLS ADDITIONAL HEALTHCARE OPTIONS FOR PATIENTS  [facility] / e-Visit: [email or web address]         [facility]: [phone]    [facility]: [phone]                   [facility]: [phone]  

## 2019-07-25 ENCOUNTER — Ambulatory Visit: Payer: Medicaid Other | Admitting: Obstetrics and Gynecology

## 2019-07-26 ENCOUNTER — Other Ambulatory Visit: Payer: Self-pay

## 2019-07-26 ENCOUNTER — Encounter: Payer: Self-pay | Admitting: Obstetrics and Gynecology

## 2019-07-26 ENCOUNTER — Ambulatory Visit (INDEPENDENT_AMBULATORY_CARE_PROVIDER_SITE_OTHER): Payer: Medicaid Other

## 2019-07-26 ENCOUNTER — Telehealth: Payer: Self-pay

## 2019-07-26 ENCOUNTER — Ambulatory Visit (INDEPENDENT_AMBULATORY_CARE_PROVIDER_SITE_OTHER): Payer: Medicaid Other | Admitting: Obstetrics and Gynecology

## 2019-07-26 VITALS — BP 118/77 | HR 86 | Wt 190.5 lb

## 2019-07-26 DIAGNOSIS — N83201 Unspecified ovarian cyst, right side: Secondary | ICD-10-CM

## 2019-07-26 DIAGNOSIS — R102 Pelvic and perineal pain: Secondary | ICD-10-CM | POA: Diagnosis not present

## 2019-07-26 NOTE — Telephone Encounter (Signed)
Pt is aware that DJE will be reviewing her U/S results and reaching out to her tomorrow. Pt stated that she was like to have surgery tomorrow if she needs to and is aware to not eat anything after midnight. Pt is expecting a call in the morning.

## 2019-07-26 NOTE — Progress Notes (Signed)
Patient comes in today for right pelvic pain.

## 2019-07-26 NOTE — Telephone Encounter (Signed)
Pt is aware that DJE will be reviewing her U/S results and reaching out to her tomorrow. Pt stated that she was like to have surgery tomorrow if she needs to and is aware to not eat anything after midnight.

## 2019-07-26 NOTE — Progress Notes (Signed)
HPI:      [name] is a 33 y.o. [phone] who LMP was Patient's last menstrual period was 07/17/2019.  Subjective:   She presents today stating that her right-sided pelvic pain was improving but then earlier this week it seemed to get worse again.  She states that she has now missed work because of the pain.  She wants to "make sure nothing is worse.". She reports no problems from her IUD.  Denies vaginal bleeding.    Hx: The following portions of the patient's history were reviewed and updated as appropriate:             She  has a past medical history of Anxiety, Constipation, Depression, and Ovarian cyst. She does not have a problem list on file. She  has a past surgical history that includes Mandible fracture surgery. Her family history includes Heart disease in her mother. She  reports that she has been smoking. She has never used smokeless tobacco. She reports current alcohol use. She reports current drug use. Frequency: 3.00 times per week. Drug: Marijuana. She has a current medication list which includes the following prescription(s): docusate sodium, fluoxetine, gabapentin, ondansetron, multivitamin-prenatal, and tramadol. She is allergic to bee venom and pepto-bismol [bismuth subsalicylate].       Review of Systems:  Review of Systems  Constitutional: Denied constitutional symptoms, night sweats, recent illness, fatigue, fever, insomnia and weight loss.  Eyes: Denied eye symptoms, eye pain, photophobia, vision change and visual disturbance.  Ears/Nose/Throat/Neck: Denied ear, nose, throat or neck symptoms, hearing loss, nasal discharge, sinus congestion and sore throat.  Cardiovascular: Denied cardiovascular symptoms, arrhythmia, chest pain/pressure, edema, exercise intolerance, orthopnea and palpitations.  Respiratory: Denied pulmonary symptoms, asthma, pleuritic pain, productive sputum, cough, dyspnea and wheezing.  Gastrointestinal: Denied, gastro-esophageal reflux,  melena, nausea and vomiting.  Genitourinary: See HPI for additional information.  Musculoskeletal: Denied musculoskeletal symptoms, stiffness, swelling, muscle weakness and myalgia.  Dermatologic: Denied dermatology symptoms, rash and scar.  Neurologic: Denied neurology symptoms, dizziness, headache, neck pain and syncope.  Psychiatric: Denied psychiatric symptoms, anxiety and depression.  Endocrine: Denied endocrine symptoms including hot flashes and night sweats.   Meds:   Current Outpatient Medications on File Prior to Visit  Medication Sig Dispense Refill  . docusate sodium (COLACE) 100 MG capsule Take 100 mg by mouth every other day.    Marland Kitchen FLUoxetine (PROZAC) 20 MG tablet Take 20 mg by mouth 2 (two) times daily.    Marland Kitchen gabapentin (NEURONTIN) 300 MG capsule Take 300 mg by mouth at bedtime.     . ondansetron (ZOFRAN ODT) 4 MG disintegrating tablet Take 1 tablet (4 mg total) by mouth every 8 (eight) hours as needed for nausea or vomiting. 12 tablet 0  . Prenatal Vit-Fe Fumarate-FA (MULTIVITAMIN-PRENATAL) 27-0.8 MG TABS tablet Take 1 tablet by mouth daily at 12 noon.    . traMADol (ULTRAM) 50 MG tablet Take 1 tablet (50 mg total) by mouth every 6 (six) hours as needed for up to 5 days for severe pain. 15 tablet 0   No current facility-administered medications on file prior to visit.     Objective:     Vitals:   07/26/19 1124  BP: 118/77  Pulse: 86                Assessment:    [id number] There are no active problems to display for this patient.    1. Pelvic pain in female   2. Cyst of right ovary  Patient seems to be having worsening pelvic pain.  She has a known right-sided hemorrhagic cyst.   Plan:            1.  Ultrasound follow-up of right ovarian cyst to make sure it is not enlarging and that rupture has not occurred. Orders Orders Placed This Encounter  Procedures  . US PELVIS (TRANSABDOMINAL ONLY)    No orders of the defined types were placed in this  encounter.     F/U  Return for We will contact her with any abnormal test results. I spent 17 minutes involved in the care of this patient of which greater than 50% was spent discussing ovarian cyst, complications of cyst, diagnostic methods including ultrasound, use of NSAIDs for pain  Elonda Huskyavid J. Kindel Rochefort, M.D. 07/26/2019 12:02 PM

## 2019-07-27 ENCOUNTER — Encounter: Payer: Self-pay | Admitting: Emergency Medicine

## 2019-07-27 ENCOUNTER — Ambulatory Visit (INDEPENDENT_AMBULATORY_CARE_PROVIDER_SITE_OTHER): Payer: Medicaid Other | Admitting: Obstetrics and Gynecology

## 2019-07-27 ENCOUNTER — Encounter: Payer: Self-pay | Admitting: Obstetrics and Gynecology

## 2019-07-27 ENCOUNTER — Encounter
Admission: RE | Disposition: A | Discharge status: Home / Self Care | Payer: Self-pay | Source: Ambulatory Visit | Attending: Obstetrics and Gynecology

## 2019-07-27 ENCOUNTER — Encounter: Payer: Self-pay | Admitting: Anesthesiology

## 2019-07-27 ENCOUNTER — Other Ambulatory Visit
Admission: RE | Admit: 2019-07-27 | Discharge: 2019-07-27 | Disposition: A | Discharge status: Home / Self Care | Payer: Medicaid Other | Source: Ambulatory Visit | Attending: Obstetrics and Gynecology | Admitting: Obstetrics and Gynecology

## 2019-07-27 ENCOUNTER — Ambulatory Visit
Admission: RE | Admit: 2019-07-27 | Discharge: 2019-07-27 | Disposition: A | Discharge status: Home / Self Care | Payer: Medicaid Other | Source: Ambulatory Visit | Attending: Obstetrics and Gynecology | Admitting: Obstetrics and Gynecology

## 2019-07-27 VITALS — BP 144/84 | HR 89 | Ht 62.0 in | Wt 191.8 lb

## 2019-07-27 DIAGNOSIS — Z5309 Procedure and treatment not carried out because of other contraindication: Secondary | ICD-10-CM | POA: Diagnosis not present

## 2019-07-27 DIAGNOSIS — F17200 Nicotine dependence, unspecified, uncomplicated: Secondary | ICD-10-CM | POA: Insufficient documentation

## 2019-07-27 DIAGNOSIS — N83201 Unspecified ovarian cyst, right side: Secondary | ICD-10-CM

## 2019-07-27 DIAGNOSIS — F419 Anxiety disorder, unspecified: Secondary | ICD-10-CM | POA: Diagnosis not present

## 2019-07-27 DIAGNOSIS — Z20828 Contact with and (suspected) exposure to other viral communicable diseases: Secondary | ICD-10-CM | POA: Diagnosis not present

## 2019-07-27 DIAGNOSIS — R102 Pelvic and perineal pain: Secondary | ICD-10-CM

## 2019-07-27 LAB — TYPE AND SCREEN
ABO/RH(D): O POS
Antibody Screen: NEGATIVE

## 2019-07-27 LAB — CBC
HCT: 32 % — ABNORMAL LOW (ref 36.0–46.0)
Hemoglobin: 9.5 g/dL — ABNORMAL LOW (ref 12.0–15.0)
MCH: 24.1 pg — ABNORMAL LOW (ref 26.0–34.0)
MCHC: 29.7 g/dL — ABNORMAL LOW (ref 30.0–36.0)
MCV: 81 fL (ref 80.0–100.0)
Platelets: 333 10*3/uL (ref 150–400)
RBC: 3.95 MIL/uL (ref 3.87–5.11)
RDW: 17.5 % — ABNORMAL HIGH (ref 11.5–15.5)
WBC: 9.2 10*3/uL (ref 4.0–10.5)
nRBC: 0 % (ref 0.0–0.2)

## 2019-07-27 LAB — URINE DRUG SCREEN, QUALITATIVE (ARMC ONLY)
Amphetamines, Ur Screen: NOT DETECTED
Barbiturates, Ur Screen: NOT DETECTED
Benzodiazepine, Ur Scrn: NOT DETECTED
Cannabinoid 50 Ng, Ur ~~LOC~~: POSITIVE — AB
Cocaine Metabolite,Ur ~~LOC~~: POSITIVE — AB
MDMA (Ecstasy)Ur Screen: NOT DETECTED
Methadone Scn, Ur: NOT DETECTED
Opiate, Ur Screen: POSITIVE — AB
Phencyclidine (PCP) Ur S: NOT DETECTED
Tricyclic, Ur Screen: NOT DETECTED

## 2019-07-27 LAB — SARS CORONAVIRUS 2 BY RT PCR (HOSPITAL ORDER, PERFORMED IN ~~LOC~~ HOSPITAL LAB): SARS Coronavirus 2: NEGATIVE

## 2019-07-27 LAB — POCT URINE PREGNANCY: Preg Test, Ur: NEGATIVE

## 2019-07-27 SURGERY — LAPAROSCOPY, DIAGNOSTIC
Anesthesia: General | Laterality: Right

## 2019-07-27 MED ORDER — FAMOTIDINE 20 MG PO TABS
20.0000 mg | ORAL_TABLET | Freq: Once | ORAL | Status: AC
  Administered 2019-07-27: 12:00:00 20 mg via ORAL

## 2019-07-27 MED ORDER — LACTATED RINGERS IV SOLN: INTRAVENOUS | Status: DC

## 2019-07-27 MED ORDER — FAMOTIDINE 20 MG PO TABS
ORAL_TABLET | ORAL | Status: AC
  Administered 2019-07-27: 20 mg via ORAL
  Filled 2019-07-27: qty 1

## 2019-07-27 MED ORDER — BUPIVACAINE HCL (PF) 0.5 % IJ SOLN
INTRAMUSCULAR | Status: AC
  Filled 2019-07-27: qty 30

## 2019-07-27 MED ORDER — PROPOFOL 10 MG/ML IV BOLUS
INTRAVENOUS | Status: AC
  Filled 2019-07-27: qty 20

## 2019-07-27 MED ORDER — FENTANYL CITRATE (PF) 100 MCG/2ML IJ SOLN
INTRAMUSCULAR | Status: AC
  Filled 2019-07-27: qty 2

## 2019-07-27 MED ORDER — LIDOCAINE HCL (PF) 2 % IJ SOLN
INTRAMUSCULAR | Status: AC
  Filled 2019-07-27: qty 10

## 2019-07-27 MED ORDER — ROCURONIUM BROMIDE 50 MG/5ML IV SOLN
INTRAVENOUS | Status: AC
  Filled 2019-07-27: qty 1

## 2019-07-27 MED ORDER — MIDAZOLAM HCL 2 MG/2ML IJ SOLN
INTRAMUSCULAR | Status: AC
  Filled 2019-07-27: qty 2

## 2019-07-27 SURGICAL SUPPLY — 42 items
BAG DECANTER FOR FLEXI CONT (MISCELLANEOUS) ×3 IMPLANT
BLADE SURG 15 STRL LF DISP TIS (BLADE) ×1 IMPLANT
BLADE SURG 15 STRL SS (BLADE) ×2
BLADE SURG SZ11 CARB STEEL (BLADE) ×3 IMPLANT
CANISTER SUCT 1200ML W/VALVE (MISCELLANEOUS) ×3 IMPLANT
CATH ROBINSON RED A/P 16FR (CATHETERS) ×3 IMPLANT
CHLORAPREP W/TINT 26 (MISCELLANEOUS) ×3 IMPLANT
CLOSURE WOUND 1/2 X4 (GAUZE/BANDAGES/DRESSINGS) ×1
COVER WAND RF STERILE (DRAPES) IMPLANT
ELECT REM PT RETURN 9FT ADLT (ELECTROSURGICAL) ×3
ELECTRODE REM PT RTRN 9FT ADLT (ELECTROSURGICAL) ×1 IMPLANT
GLOVE BIOGEL PI ORTHO PRO 7.5 (GLOVE) ×2
GLOVE PI ORTHO PRO STRL 7.5 (GLOVE) ×1 IMPLANT
GLOVE SURG SYN 8.0 (GLOVE) ×3 IMPLANT
GOWN STRL REUS W/ TWL LRG LVL3 (GOWN DISPOSABLE) ×2 IMPLANT
GOWN STRL REUS W/TWL LRG LVL3 (GOWN DISPOSABLE) ×4
GOWN STRL REUS W/TWL XL LVL4 (GOWN DISPOSABLE) ×3 IMPLANT
IRRIGATION STRYKERFLOW (MISCELLANEOUS) IMPLANT
IRRIGATOR STRYKERFLOW (MISCELLANEOUS)
IV LACTATED RINGERS 1000ML (IV SOLUTION) ×3 IMPLANT
KIT PINK PAD W/HEAD ARE REST (MISCELLANEOUS) ×3
KIT PINK PAD W/HEAD ARM REST (MISCELLANEOUS) ×1 IMPLANT
KIT TURNOVER CYSTO (KITS) ×3 IMPLANT
LIGASURE LAP MARYLAND 5MM 37CM (ELECTROSURGICAL) IMPLANT
NEEDLE HYPO 22GX1.5 SAFETY (NEEDLE) ×3 IMPLANT
NS IRRIG 500ML POUR BTL (IV SOLUTION) ×3 IMPLANT
PACK GYN LAPAROSCOPIC (MISCELLANEOUS) ×3 IMPLANT
PAD OB MATERNITY 4.3X12.25 (PERSONAL CARE ITEMS) ×3 IMPLANT
PAD PREP 24X41 OB/GYN DISP (PERSONAL CARE ITEMS) ×3 IMPLANT
SET TUBE SMOKE EVAC HIGH FLOW (TUBING) ×3 IMPLANT
SLEEVE ENDOPATH XCEL 5M (ENDOMECHANICALS) IMPLANT
STRIP CLOSURE SKIN 1/2X4 (GAUZE/BANDAGES/DRESSINGS) ×2 IMPLANT
SUT VICRYL 0 AB UR-6 (SUTURE) ×3 IMPLANT
SUT VICRYL 4-0  27 PS-2 BARIAT (SUTURE) ×2
SUT VICRYL 4-0 27 PS-2 BARIAT (SUTURE) ×1
SUTURE VICRYL 4-0 27 PS-2 BART (SUTURE) ×1 IMPLANT
TOWEL OR 17X26 4PK STRL BLUE (TOWEL DISPOSABLE) ×3 IMPLANT
TROCAR ENDO BLADELESS 11MM (ENDOMECHANICALS) IMPLANT
TROCAR XCEL NON-BLD 5MMX100MML (ENDOMECHANICALS) ×3 IMPLANT
TROCAR XCEL UNIV SLVE 11M 100M (ENDOMECHANICALS) IMPLANT
TUBING CONNECTING 10 (TUBING) ×2 IMPLANT
TUBING CONNECTING 10' (TUBING) ×1

## 2019-07-27 NOTE — H&P (View-Only) (Signed)
PRE-OPERATIVE HISTORY AND PHYSICAL EXAM  PCP:  Cl[facility], [facility] Subjective:   HPI:  Charlotte Hawkins is a 33 y.o. [phone].  Patient's last menstrual period was 07/17/2019.  She presents today for a pre-op discussion and PE.  She has the following symptoms: Worsening pelvic pain.  Ultrasound reveals no change in hemorrhagic cyst but appearance of a right fallopian tube dilation.  Patient desires surgery as soon as possible because of her pain.   Review of Systems:   Constitutional: Denied constitutional symptoms, night sweats, recent illness, fatigue, fever, insomnia and weight loss.  Eyes: Denied eye symptoms, eye pain, photophobia, vision change and visual disturbance.  Ears/Nose/Throat/Neck: Denied ear, nose, throat or neck symptoms, hearing loss, nasal discharge, sinus congestion and sore throat.  Cardiovascular: Denied cardiovascular symptoms, arrhythmia, chest pain/pressure, edema, exercise intolerance, orthopnea and palpitations.  Respiratory: Denied pulmonary symptoms, asthma, pleuritic pain, productive sputum, cough, dyspnea and wheezing.  Gastrointestinal: Denied, gastro-esophageal reflux, melena, nausea and vomiting.  Genitourinary:  Worsening pelvic/abdominal pain.  Musculoskeletal: Denied musculoskeletal symptoms, stiffness, swelling, muscle weakness and myalgia.  Dermatologic: Denied dermatology symptoms, rash and scar.  Neurologic: Denied neurology symptoms, dizziness, headache, neck pain and syncope.  Psychiatric: Denied psychiatric symptoms, anxiety and depression.  Endocrine: Denied endocrine symptoms including hot flashes and night sweats.   OB History  Gravida Para Term Preterm AB Living  4 4   4   4   SAB TAB Ectopic Multiple Live Births          4    # Outcome Date GA Lbr Len/2nd Weight Sex Delivery Anes PTL Lv  4 Preterm 2019    F Vag-Spont   LIV  3 Preterm 2014    M Vag-Spont   LIV  2 Preterm 2011    M Vag-Spont   LIV  1 Preterm 2007    M Vag-Spont    LIV    Past Medical History:  Diagnosis Date  . Anxiety   . Constipation   . Depression   . Ovarian cyst     Past Surgical History:  Procedure Laterality Date  . MANDIBLE FRACTURE SURGERY        SOCIAL HISTORY:  Social History   Tobacco Use  Smoking Status Current Every Day Smoker  Smokeless Tobacco Never Used   Social History   Substance and Sexual Activity  Alcohol Use Yes   Comment: rare   Substance and Sexual Activity  Alcohol Use Yes   Comment: rare     Substance and Sexual Activity  Drug Use Yes  . Frequency: 3.0 times per week  . Types: Marijuana    Family History  Problem Relation Age of Onset  . Heart disease Mother   . Breast cancer Neg Hx   . Ovarian cancer Neg Hx   . Colon cancer Neg Hx   . Diabetes Neg Hx     ALLERGIES:  Bee venom and Pepto-bismol [bismuth subsalicylate]  MEDS:   Current Outpatient Medications on File Prior to Visit  Medication Sig Dispense Refill  . docusate sodium (COLACE) 100 MG capsule Take 100 mg by mouth every other day.    Marland Kitchen FLUoxetine (PROZAC) 20 MG tablet Take 20 mg by mouth 2 (two) times daily.    Marland Kitchen gabapentin (NEURONTIN) 300 MG capsule Take 300 mg by mouth at bedtime.     . ondansetron (ZOFRAN ODT) 4 MG disintegrating tablet Take 1 tablet (4 mg total) by mouth every 8 (eight) hours as needed for  nausea or vomiting. 12 tablet 0  . Prenatal Vit-Fe Fumarate-FA (MULTIVITAMIN-PRENATAL) 27-0.8 MG TABS tablet Take 1 tablet by mouth daily at 12 noon.    . traMADol (ULTRAM) 50 MG tablet Take 1 tablet (50 mg total) by mouth every 6 (six) hours as needed for up to 5 days for severe pain. 15 tablet 0   No current facility-administered medications on file prior to visit.     No orders of the defined types were placed in this encounter.    Physical examination BP (!) 144/84   Pulse 89   Ht 5\' 2"  (1.575 m)   Wt 191 lb 12.8 oz (87 kg)   LMP 07/17/2019   BMI 35.08 kg/m   General NAD, Conversant  HEENT Atraumatic;  Op clear with mmm.  Normo-cephalic. Pupils reactive. Anicteric sclerae  Thyroid/Neck Smooth without nodularity or enlargement. Normal ROM.  Neck Supple.  Skin No rashes, lesions or ulceration. Normal palpated skin turgor. No nodularity.  Breasts: No masses or discharge.  Symmetric.  No axillary adenopathy.  Lungs: Clear to auscultation.No rales or wheezes. Normal Respiratory effort, no retractions.  Heart: NSR.  No murmurs or rubs appreciated. No periferal edema  Abdomen: Soft.  Tender to palpation.  No masses.  No HSM. No hernia  Extremities: Moves all appropriately.  Normal ROM for age. No lymphadenopathy.  Neuro: Oriented to PPT.  Normal mood. Normal affect.   The uterus is anteverted and measures 8.7 x 4.1 x 5.7 cm.. Echo texture is homogenous without evidence of focal masses.  The Endometrium measures 6 mm. IUD seen cetraly located.  Right Ovary measures 3.5 x 2.7 x 2.5  cm. It is normal in appearance. Left Ovary measures 3.9 x 1.9 x 3.7  cm. It is normal in appearance. Survey of the adnexa demonstrates adjacent to the Lt ovary  a 4.4 x 2.8 x 2.3 cm complex heterogeneous structure with internal echoes. There is  free fluid in the cul de sac.   Impression: 1. Lt ovary appears normal with a heterogeneous complex structure with internal echoes just adjacent to it as described above.     Could be fallopian tube. 2. IUD was seen central located in the endometruim.  Assessment:   [id number] There are no active problems to display for this patient.   1. Pelvic pain in female   2. Cyst of right ovary    Worsening pelvic pain right hemorrhagic cyst right dilated fallopian tube   Plan:   Orders: No orders of the defined types were placed in this encounter.    1.  Exploratory laparoscopy, right salpingectomy possible right oophorectomy  Pre-op discussions regarding Risks and Benefits of her scheduled surgery.  Risks and benefits discussed in detail.  Possible future  infertility discussed, specific risks of anesthesia, blood loss, infection, damage to other internal organs discussed.  All questions answered.

## 2019-07-27 NOTE — Interval H&P Note (Signed)
History and Physical Interval Note:  07/27/2019 11:41 AM  Charlotte Hawkins  has presented today for surgery, with the diagnosis of Hemmorraghic Right Ovarian Cyst.  The various methods of treatment have been discussed with the patient and family. After consideration of risks, benefits and other options for treatment, the patient has consented to  Procedure(s): LAPAROSCOPY DIAGNOSTIC (Right) LAPAROSCOPIC UNILATERAL SALPINGECTOMY POSSIBLE OOPHORECTOMY (Right) as a surgical intervention.  The patient's history has been reviewed, patient examined, no change in status, stable for surgery.  I have reviewed the patient's chart and labs.  Questions were answered to the patient's satisfaction.     Jeannie Fend

## 2019-07-27 NOTE — Anesthesia Preprocedure Evaluation (Deleted)
Anesthesia Evaluation  Patient identified by MRN, date of birth, ID band Patient awake    Reviewed: Allergy & Precautions, H&P , NPO status , Patient's Chart, lab work & pertinent test results, reviewed documented beta blocker date and time   Airway Mallampati: II  TM Distance: >3 FB Neck ROM: full    Dental  (+) Teeth Intact   Pulmonary neg pulmonary ROS, Current Smoker and Patient abstained from smoking.,    Pulmonary exam normal        Cardiovascular Exercise Tolerance: Good negative cardio ROS Normal cardiovascular exam Rhythm:regular Rate:Normal     Neuro/Psych PSYCHIATRIC DISORDERS Anxiety Depression negative neurological ROS     GI/Hepatic negative GI ROS, Neg liver ROS,   Endo/Other  negative endocrine ROS  Renal/GU negative Renal ROS  negative genitourinary   Musculoskeletal   Abdominal   Peds  Hematology negative hematology ROS (+)   Anesthesia Other Findings Past Medical History: No date: Anxiety No date: Constipation No date: Depression No date: Ovarian cyst Past Surgical History: No date: MANDIBLE FRACTURE SURGERY BMI    Body Mass Index: 35.08 kg/m     Reproductive/Obstetrics negative OB ROS                             Anesthesia Physical Anesthesia Plan  ASA: II  Anesthesia Plan: General ETT   Post-op Pain Management:    Induction:   PONV Risk Score and Plan:   Airway Management Planned:   Additional Equipment:   Intra-op Plan:   Post-operative Plan:   Informed Consent: I have reviewed the patients History and Physical, chart, labs and discussed the procedure including the risks, benefits and alternatives for the proposed anesthesia with the patient or authorized representative who has indicated his/her understanding and acceptance.     Dental Advisory Given  Plan Discussed with: CRNA  Anesthesia Plan Comments:         Anesthesia Quick  Evaluation

## 2019-07-27 NOTE — Progress Notes (Signed)
Patient comes in today for pre op. She came in today in severe pain.

## 2019-07-27 NOTE — Addendum Note (Signed)
Addended by: Durwin Glaze on: 07/27/2019 11:03 AM   Modules accepted: Orders

## 2019-07-27 NOTE — Progress Notes (Signed)
PRE-OPERATIVE HISTORY AND PHYSICAL EXAM  PCP:  [facility] Subjective:   HPI:  Charlotte Hawkins is a 33 y.o. [phone].  Patient's last menstrual period was 07/17/2019.  She presents today for a pre-op discussion and PE.  She has the following symptoms: Worsening pelvic pain.  Ultrasound reveals no change in hemorrhagic cyst but appearance of a right fallopian tube dilation.  Patient desires surgery as soon as possible because of her pain.   Review of Systems:   Constitutional: Denied constitutional symptoms, night sweats, recent illness, fatigue, fever, insomnia and weight loss.  Eyes: Denied eye symptoms, eye pain, photophobia, vision change and visual disturbance.  Ears/Nose/Throat/Neck: Denied ear, nose, throat or neck symptoms, hearing loss, nasal discharge, sinus congestion and sore throat.  Cardiovascular: Denied cardiovascular symptoms, arrhythmia, chest pain/pressure, edema, exercise intolerance, orthopnea and palpitations.  Respiratory: Denied pulmonary symptoms, asthma, pleuritic pain, productive sputum, cough, dyspnea and wheezing.  Gastrointestinal: Denied, gastro-esophageal reflux, melena, nausea and vomiting.  Genitourinary:  Worsening pelvic/abdominal pain.  Musculoskeletal: Denied musculoskeletal symptoms, stiffness, swelling, muscle weakness and myalgia.  Dermatologic: Denied dermatology symptoms, rash and scar.  Neurologic: Denied neurology symptoms, dizziness, headache, neck pain and syncope.  Psychiatric: Denied psychiatric symptoms, anxiety and depression.  Endocrine: Denied endocrine symptoms including hot flashes and night sweats.   OB History  Gravida Para Term Preterm AB Living  4 4   4   4   SAB TAB Ectopic Multiple Live Births          4    # Outcome Date GA Lbr Len/2nd Weight Sex Delivery Anes PTL Lv  4 Preterm 2019    F Vag-Spont   LIV  3 Preterm 2014    M Vag-Spont   LIV  2 Preterm 2011    M Vag-Spont   LIV  1 Preterm 2007    M Vag-Spont    LIV    Past Medical History:  Diagnosis Date  . Anxiety   . Constipation   . Depression   . Ovarian cyst     Past Surgical History:  Procedure Laterality Date  . MANDIBLE FRACTURE SURGERY        SOCIAL HISTORY:  Social History   Tobacco Use  Smoking Status Current Every Day Smoker  Smokeless Tobacco Never Used   Social History   Substance and Sexual Activity  Alcohol Use Yes   Comment: rare   Substance and Sexual Activity  Alcohol Use Yes   Comment: rare     Substance and Sexual Activity  Drug Use Yes  . Frequency: 3.0 times per week  . Types: Marijuana    Family History  Problem Relation Age of Onset  . Heart disease Mother   . Breast cancer Neg Hx   . Ovarian cancer Neg Hx   . Colon cancer Neg Hx   . Diabetes Neg Hx     ALLERGIES:  Bee venom and Pepto-bismol [bismuth subsalicylate]  MEDS:   Current Outpatient Medications on File Prior to Visit  Medication Sig Dispense Refill  . docusate sodium (COLACE) 100 MG capsule Take 100 mg by mouth every other day.    Marland Kitchen FLUoxetine (PROZAC) 20 MG tablet Take 20 mg by mouth 2 (two) times daily.    Marland Kitchen gabapentin (NEURONTIN) 300 MG capsule Take 300 mg by mouth at bedtime.     . ondansetron (ZOFRAN ODT) 4 MG disintegrating tablet Take 1 tablet (4 mg total) by mouth every 8 (eight) hours as needed for  nausea or vomiting. 12 tablet 0  . Prenatal Vit-Fe Fumarate-FA (MULTIVITAMIN-PRENATAL) 27-0.8 MG TABS tablet Take 1 tablet by mouth daily at 12 noon.    . traMADol (ULTRAM) 50 MG tablet Take 1 tablet (50 mg total) by mouth every 6 (six) hours as needed for up to 5 days for severe pain. 15 tablet 0   No current facility-administered medications on file prior to visit.     No orders of the defined types were placed in this encounter.    Physical examination BP (!) 144/84   Pulse 89   Ht 5\' 2"  (1.575 m)   Wt 191 lb 12.8 oz (87 kg)   LMP 07/17/2019   BMI 35.08 kg/m   General NAD, Conversant  HEENT Atraumatic;  Op clear with mmm.  Normo-cephalic. Pupils reactive. Anicteric sclerae  Thyroid/Neck Smooth without nodularity or enlargement. Normal ROM.  Neck Supple.  Skin No rashes, lesions or ulceration. Normal palpated skin turgor. No nodularity.  Breasts: No masses or discharge.  Symmetric.  No axillary adenopathy.  Lungs: Clear to auscultation.No rales or wheezes. Normal Respiratory effort, no retractions.  Heart: NSR.  No murmurs or rubs appreciated. No periferal edema  Abdomen: Soft.  Tender to palpation.  No masses.  No HSM. No hernia  Extremities: Moves all appropriately.  Normal ROM for age. No lymphadenopathy.  Neuro: Oriented to PPT.  Normal mood. Normal affect.   The uterus is anteverted and measures 8.7 x 4.1 x 5.7 cm.. Echo texture is homogenous without evidence of focal masses.  The Endometrium measures 6 mm. IUD seen cetraly located.  Right Ovary measures 3.5 x 2.7 x 2.5  cm. It is normal in appearance. Left Ovary measures 3.9 x 1.9 x 3.7  cm. It is normal in appearance. Survey of the adnexa demonstrates adjacent to the Lt ovary  a 4.4 x 2.8 x 2.3 cm complex heterogeneous structure with internal echoes. There is  free fluid in the cul de sac.   Impression: 1. Lt ovary appears normal with a heterogeneous complex structure with internal echoes just adjacent to it as described above.     Could be fallopian tube. 2. IUD was seen central located in the endometruim.  Assessment:   [id number] There are no active problems to display for this patient.   1. Pelvic pain in female   2. Cyst of right ovary    Worsening pelvic pain right hemorrhagic cyst right dilated fallopian tube   Plan:   Orders: No orders of the defined types were placed in this encounter.    1.  Exploratory laparoscopy, right salpingectomy possible right oophorectomy  Pre-op discussions regarding Risks and Benefits of her scheduled surgery.  Risks and benefits discussed in detail.  Possible future  infertility discussed, specific risks of anesthesia, blood loss, infection, damage to other internal organs discussed.  All questions answered. I spent 35 minutes involved in the care of this patient of which greater than 50% was spent discussing pelvic pain, ovarian cyst, tubal dilation, surgical options, risks and benefits of surgery.  Elonda Huskyavid J. Evans, M.D. 07/27/2019 10:52 AM

## 2019-07-27 NOTE — H&P (Signed)
PRE-OPERATIVE HISTORY AND PHYSICAL EXAM  PCP:  [facility] Subjective:   HPI:  Charlotte Hawkins is a 33 y.o. [phone].  Patient's last menstrual period was 07/17/2019.  She presents today for a pre-op discussion and PE.  She has the following symptoms: Worsening pelvic pain.  Ultrasound reveals no change in hemorrhagic cyst but appearance of a right fallopian tube dilation.  Patient desires surgery as soon as possible because of her pain.   Review of Systems:   Constitutional: Denied constitutional symptoms, night sweats, recent illness, fatigue, fever, insomnia and weight loss.  Eyes: Denied eye symptoms, eye pain, photophobia, vision change and visual disturbance.  Ears/Nose/Throat/Neck: Denied ear, nose, throat or neck symptoms, hearing loss, nasal discharge, sinus congestion and sore throat.  Cardiovascular: Denied cardiovascular symptoms, arrhythmia, chest pain/pressure, edema, exercise intolerance, orthopnea and palpitations.  Respiratory: Denied pulmonary symptoms, asthma, pleuritic pain, productive sputum, cough, dyspnea and wheezing.  Gastrointestinal: Denied, gastro-esophageal reflux, melena, nausea and vomiting.  Genitourinary:  Worsening pelvic/abdominal pain.  Musculoskeletal: Denied musculoskeletal symptoms, stiffness, swelling, muscle weakness and myalgia.  Dermatologic: Denied dermatology symptoms, rash and scar.  Neurologic: Denied neurology symptoms, dizziness, headache, neck pain and syncope.  Psychiatric: Denied psychiatric symptoms, anxiety and depression.  Endocrine: Denied endocrine symptoms including hot flashes and night sweats.   OB History  Gravida Para Term Preterm AB Living  4 4   4   4   SAB TAB Ectopic Multiple Live Births          4    # Outcome Date GA Lbr Len/2nd Weight Sex Delivery Anes PTL Lv  4 Preterm 2019    F Vag-Spont   LIV  3 Preterm 2014    M Vag-Spont   LIV  2 Preterm 2011    M Vag-Spont   LIV  1 Preterm 2007    M Vag-Spont    LIV    Past Medical History:  Diagnosis Date  . Anxiety   . Constipation   . Depression   . Ovarian cyst     Past Surgical History:  Procedure Laterality Date  . MANDIBLE FRACTURE SURGERY        SOCIAL HISTORY:  Social History   Tobacco Use  Smoking Status Current Every Day Smoker  Smokeless Tobacco Never Used   Social History   Substance and Sexual Activity  Alcohol Use Yes   Comment: rare   Substance and Sexual Activity  Alcohol Use Yes   Comment: rare     Substance and Sexual Activity  Drug Use Yes  . Frequency: 3.0 times per week  . Types: Marijuana    Family History  Problem Relation Age of Onset  . Heart disease Mother   . Breast cancer Neg Hx   . Ovarian cancer Neg Hx   . Colon cancer Neg Hx   . Diabetes Neg Hx     ALLERGIES:  Bee venom and Pepto-bismol [bismuth subsalicylate]  MEDS:   Current Outpatient Medications on File Prior to Visit  Medication Sig Dispense Refill  . docusate sodium (COLACE) 100 MG capsule Take 100 mg by mouth every other day.    Marland Kitchen FLUoxetine (PROZAC) 20 MG tablet Take 20 mg by mouth 2 (two) times daily.    Marland Kitchen gabapentin (NEURONTIN) 300 MG capsule Take 300 mg by mouth at bedtime.     . ondansetron (ZOFRAN ODT) 4 MG disintegrating tablet Take 1 tablet (4 mg total) by mouth every 8 (eight) hours as needed for  nausea or vomiting. 12 tablet 0  . Prenatal Vit-Fe Fumarate-FA (MULTIVITAMIN-PRENATAL) 27-0.8 MG TABS tablet Take 1 tablet by mouth daily at 12 noon.    . traMADol (ULTRAM) 50 MG tablet Take 1 tablet (50 mg total) by mouth every 6 (six) hours as needed for up to 5 days for severe pain. 15 tablet 0   No current facility-administered medications on file prior to visit.     No orders of the defined types were placed in this encounter.    Physical examination BP (!) 144/84   Pulse 89   Ht 5\' 2"  (1.575 m)   Wt 191 lb 12.8 oz (87 kg)   LMP 07/17/2019   BMI 35.08 kg/m   General NAD, Conversant  HEENT Atraumatic;  Op clear with mmm.  Normo-cephalic. Pupils reactive. Anicteric sclerae  Thyroid/Neck Smooth without nodularity or enlargement. Normal ROM.  Neck Supple.  Skin No rashes, lesions or ulceration. Normal palpated skin turgor. No nodularity.  Breasts: No masses or discharge.  Symmetric.  No axillary adenopathy.  Lungs: Clear to auscultation.No rales or wheezes. Normal Respiratory effort, no retractions.  Heart: NSR.  No murmurs or rubs appreciated. No periferal edema  Abdomen: Soft.  Tender to palpation.  No masses.  No HSM. No hernia  Extremities: Moves all appropriately.  Normal ROM for age. No lymphadenopathy.  Neuro: Oriented to PPT.  Normal mood. Normal affect.   The uterus is anteverted and measures 8.7 x 4.1 x 5.7 cm.. Echo texture is homogenous without evidence of focal masses.  The Endometrium measures 6 mm. IUD seen cetraly located.  Right Ovary measures 3.5 x 2.7 x 2.5  cm. It is normal in appearance. Left Ovary measures 3.9 x 1.9 x 3.7  cm. It is normal in appearance. Survey of the adnexa demonstrates adjacent to the Lt ovary  a 4.4 x 2.8 x 2.3 cm complex heterogeneous structure with internal echoes. There is  free fluid in the cul de sac.   Impression: 1. Lt ovary appears normal with a heterogeneous complex structure with internal echoes just adjacent to it as described above.     Could be fallopian tube. 2. IUD was seen central located in the endometruim.  Assessment:   [id number] There are no active problems to display for this patient.   1. Pelvic pain in female   2. Cyst of right ovary    Worsening pelvic pain right hemorrhagic cyst right dilated fallopian tube   Plan:   Orders: No orders of the defined types were placed in this encounter.    1.  Exploratory laparoscopy, right salpingectomy possible right oophorectomy  Pre-op discussions regarding Risks and Benefits of her scheduled surgery.  Risks and benefits discussed in detail.  Possible future  infertility discussed, specific risks of anesthesia, blood loss, infection, damage to other internal organs discussed.  All questions answered.

## 2019-07-27 NOTE — Telephone Encounter (Signed)
Scheduled patient to come in this morning to see Dr. Amalia Hailey.

## 2019-07-27 NOTE — Progress Notes (Signed)
Patients UDS positive. Per Dr Amalia Hailey r/s for Monday.

## 2019-07-27 NOTE — Addendum Note (Signed)
Addended by: Durwin Glaze on: 07/27/2019 11:28 AM   Modules accepted: Orders

## 2019-07-28 ENCOUNTER — Encounter: Payer: Self-pay | Admitting: Anesthesiology

## 2019-07-30 ENCOUNTER — Other Ambulatory Visit: Payer: Self-pay

## 2019-07-30 ENCOUNTER — Ambulatory Visit: Payer: Medicaid Other | Admitting: Anesthesiology

## 2019-07-30 ENCOUNTER — Encounter: Payer: Self-pay | Admitting: Emergency Medicine

## 2019-07-30 ENCOUNTER — Encounter
Admission: RE | Disposition: A | Discharge status: Home / Self Care | Payer: Self-pay | Source: Home / Self Care | Attending: Obstetrics and Gynecology

## 2019-07-30 ENCOUNTER — Ambulatory Visit
Admission: RE | Admit: 2019-07-30 | Discharge: 2019-07-30 | Disposition: A | Discharge status: Home / Self Care | Payer: Medicaid Other | Attending: Obstetrics and Gynecology | Admitting: Obstetrics and Gynecology

## 2019-07-30 DIAGNOSIS — R102 Pelvic and perineal pain: Secondary | ICD-10-CM | POA: Insufficient documentation

## 2019-07-30 DIAGNOSIS — Z91030 Bee allergy status: Secondary | ICD-10-CM | POA: Insufficient documentation

## 2019-07-30 DIAGNOSIS — N7001 Acute salpingitis: Secondary | ICD-10-CM | POA: Insufficient documentation

## 2019-07-30 DIAGNOSIS — F17200 Nicotine dependence, unspecified, uncomplicated: Secondary | ICD-10-CM | POA: Diagnosis not present

## 2019-07-30 DIAGNOSIS — Z888 Allergy status to other drugs, medicaments and biological substances status: Secondary | ICD-10-CM | POA: Insufficient documentation

## 2019-07-30 DIAGNOSIS — K5900 Constipation, unspecified: Secondary | ICD-10-CM | POA: Insufficient documentation

## 2019-07-30 DIAGNOSIS — F419 Anxiety disorder, unspecified: Secondary | ICD-10-CM | POA: Diagnosis not present

## 2019-07-30 DIAGNOSIS — F329 Major depressive disorder, single episode, unspecified: Secondary | ICD-10-CM | POA: Insufficient documentation

## 2019-07-30 DIAGNOSIS — N736 Female pelvic peritoneal adhesions (postinfective): Secondary | ICD-10-CM | POA: Insufficient documentation

## 2019-07-30 DIAGNOSIS — Z79899 Other long term (current) drug therapy: Secondary | ICD-10-CM | POA: Diagnosis not present

## 2019-07-30 DIAGNOSIS — N739 Female pelvic inflammatory disease, unspecified: Secondary | ICD-10-CM | POA: Diagnosis not present

## 2019-07-30 DIAGNOSIS — N83201 Unspecified ovarian cyst, right side: Secondary | ICD-10-CM

## 2019-07-30 DIAGNOSIS — Z8249 Family history of ischemic heart disease and other diseases of the circulatory system: Secondary | ICD-10-CM | POA: Diagnosis not present

## 2019-07-30 HISTORY — PX: LAPAROSCOPY: SHX197

## 2019-07-30 HISTORY — PX: LAPAROSCOPIC UNILATERAL SALPINGECTOMY: SHX5934

## 2019-07-30 LAB — URINE DRUG SCREEN, QUALITATIVE (ARMC ONLY)
Amphetamines, Ur Screen: NOT DETECTED
Barbiturates, Ur Screen: NOT DETECTED
Benzodiazepine, Ur Scrn: NOT DETECTED
Cannabinoid 50 Ng, Ur ~~LOC~~: POSITIVE — AB
Cocaine Metabolite,Ur ~~LOC~~: NOT DETECTED
MDMA (Ecstasy)Ur Screen: NOT DETECTED
Methadone Scn, Ur: NOT DETECTED
Opiate, Ur Screen: POSITIVE — AB
Phencyclidine (PCP) Ur S: NOT DETECTED
Tricyclic, Ur Screen: NOT DETECTED

## 2019-07-30 LAB — PREGNANCY, URINE: Preg Test, Ur: NEGATIVE

## 2019-07-30 SURGERY — LAPAROSCOPY, DIAGNOSTIC
Anesthesia: General | Laterality: Right

## 2019-07-30 MED ORDER — ROCURONIUM BROMIDE 100 MG/10ML IV SOLN
INTRAVENOUS | Status: DC | PRN
  Administered 2019-07-30 (×2): 10 mg via INTRAVENOUS
  Administered 2019-07-30: 20 mg via INTRAVENOUS
  Administered 2019-07-30: 10 mg via INTRAVENOUS

## 2019-07-30 MED ORDER — SODIUM CHLORIDE 0.9 % IV SOLN
2.0000 g | Freq: Two times a day (BID) | INTRAVENOUS | Status: AC
  Administered 2019-07-30: 14:00:00 2 g via INTRAVENOUS
  Filled 2019-07-30: qty 2

## 2019-07-30 MED ORDER — ONDANSETRON 4 MG PO TBDP: 4.0000 mg | ORAL_TABLET | Freq: Four times a day (QID) | ORAL | Status: DC | PRN

## 2019-07-30 MED ORDER — SODIUM CHLORIDE 0.9 % IV SOLN
INTRAVENOUS | Status: AC
  Filled 2019-07-30: qty 2

## 2019-07-30 MED ORDER — PROPOFOL 10 MG/ML IV BOLUS
INTRAVENOUS | Status: AC
  Filled 2019-07-30: qty 20

## 2019-07-30 MED ORDER — HYDROMORPHONE HCL 1 MG/ML IJ SOLN
INTRAMUSCULAR | Status: AC
  Administered 2019-07-30: 0.5 mg via INTRAVENOUS
  Filled 2019-07-30: qty 1

## 2019-07-30 MED ORDER — ACETAMINOPHEN 325 MG PO TABS
ORAL_TABLET | ORAL | Status: AC
  Administered 2019-07-30: 14:00:00 975 mg via ORAL
  Filled 2019-07-30: qty 3

## 2019-07-30 MED ORDER — OXYCODONE-ACETAMINOPHEN 5-325 MG PO TABS
1.0000 | ORAL_TABLET | ORAL | Status: DC | PRN
  Administered 2019-07-30: 1 via ORAL

## 2019-07-30 MED ORDER — MIDAZOLAM HCL 2 MG/2ML IJ SOLN
INTRAMUSCULAR | Status: DC | PRN
  Administered 2019-07-30: 2 mg via INTRAVENOUS

## 2019-07-30 MED ORDER — ROCURONIUM BROMIDE 50 MG/5ML IV SOLN
INTRAVENOUS | Status: AC
  Filled 2019-07-30: qty 1

## 2019-07-30 MED ORDER — FENTANYL CITRATE (PF) 100 MCG/2ML IJ SOLN
INTRAMUSCULAR | Status: AC
  Filled 2019-07-30: qty 2

## 2019-07-30 MED ORDER — HYDROMORPHONE HCL 1 MG/ML IJ SOLN
0.5000 mg | INTRAMUSCULAR | Status: DC | PRN
  Administered 2019-07-30 (×2): 0.5 mg via INTRAVENOUS

## 2019-07-30 MED ORDER — ACETAMINOPHEN 325 MG PO TABS
1000.0000 mg | ORAL_TABLET | Freq: Once | ORAL | Status: AC
  Administered 2019-07-30: 14:00:00 975 mg via ORAL

## 2019-07-30 MED ORDER — FENTANYL CITRATE (PF) 100 MCG/2ML IJ SOLN
INTRAMUSCULAR | Status: AC
  Administered 2019-07-30: 25 ug via INTRAVENOUS
  Filled 2019-07-30: qty 2

## 2019-07-30 MED ORDER — SUCCINYLCHOLINE CHLORIDE 20 MG/ML IJ SOLN
INTRAMUSCULAR | Status: DC | PRN
  Administered 2019-07-30: 100 mg via INTRAVENOUS

## 2019-07-30 MED ORDER — DEXAMETHASONE SODIUM PHOSPHATE 10 MG/ML IJ SOLN
INTRAMUSCULAR | Status: DC | PRN
  Administered 2019-07-30: 8 mg via INTRAVENOUS

## 2019-07-30 MED ORDER — OXYCODONE-ACETAMINOPHEN 5-325 MG PO TABS: 1.0000 | ORAL_TABLET | ORAL | 0 refills | Status: DC | PRN

## 2019-07-30 MED ORDER — LIDOCAINE HCL (CARDIAC) PF 100 MG/5ML IV SOSY
PREFILLED_SYRINGE | INTRAVENOUS | Status: DC | PRN
  Administered 2019-07-30: 100 mg via INTRAVENOUS

## 2019-07-30 MED ORDER — ONDANSETRON HCL 4 MG/2ML IJ SOLN: 4.0000 mg | Freq: Four times a day (QID) | INTRAMUSCULAR | Status: DC | PRN

## 2019-07-30 MED ORDER — FAMOTIDINE 20 MG PO TABS: 20.0000 mg | ORAL_TABLET | Freq: Once | ORAL | Status: DC

## 2019-07-30 MED ORDER — METRONIDAZOLE 500 MG PO TABS: 500.0000 mg | ORAL_TABLET | Freq: Two times a day (BID) | ORAL | 0 refills | Status: AC

## 2019-07-30 MED ORDER — PROPOFOL 10 MG/ML IV BOLUS
INTRAVENOUS | Status: DC | PRN
  Administered 2019-07-30: 150 mg via INTRAVENOUS

## 2019-07-30 MED ORDER — BUPIVACAINE HCL (PF) 0.5 % IJ SOLN
INTRAMUSCULAR | Status: AC
  Filled 2019-07-30: qty 30

## 2019-07-30 MED ORDER — KETOROLAC TROMETHAMINE 30 MG/ML IJ SOLN
30.0000 mg | Freq: Once | INTRAMUSCULAR | Status: AC
  Administered 2019-07-30: 13:00:00 30 mg via INTRAVENOUS

## 2019-07-30 MED ORDER — MIDAZOLAM HCL 2 MG/2ML IJ SOLN
INTRAMUSCULAR | Status: AC
  Filled 2019-07-30: qty 2

## 2019-07-30 MED ORDER — ONDANSETRON HCL 4 MG/2ML IJ SOLN: 4.0000 mg | Freq: Once | INTRAMUSCULAR | Status: DC | PRN

## 2019-07-30 MED ORDER — LEVOFLOXACIN 500 MG PO TABS: 500.0000 mg | ORAL_TABLET | Freq: Every day | ORAL | 0 refills | Status: AC

## 2019-07-30 MED ORDER — BUPIVACAINE HCL 0.5 % IJ SOLN
INTRAMUSCULAR | Status: DC | PRN
  Administered 2019-07-30: 10 mL

## 2019-07-30 MED ORDER — FENTANYL CITRATE (PF) 100 MCG/2ML IJ SOLN
25.0000 ug | INTRAMUSCULAR | Status: AC | PRN
  Administered 2019-07-30 (×6): 25 ug via INTRAVENOUS

## 2019-07-30 MED ORDER — KETOROLAC TROMETHAMINE 30 MG/ML IJ SOLN
INTRAMUSCULAR | Status: AC
  Filled 2019-07-30: qty 1

## 2019-07-30 MED ORDER — DEXTROSE IN LACTATED RINGERS 5 % IV SOLN: INTRAVENOUS | Status: DC

## 2019-07-30 MED ORDER — SUGAMMADEX SODIUM 200 MG/2ML IV SOLN
INTRAVENOUS | Status: DC | PRN
  Administered 2019-07-30: 174 mg via INTRAVENOUS

## 2019-07-30 MED ORDER — SUGAMMADEX SODIUM 200 MG/2ML IV SOLN
INTRAVENOUS | Status: AC
  Filled 2019-07-30: qty 2

## 2019-07-30 MED ORDER — OXYCODONE-ACETAMINOPHEN 5-325 MG PO TABS
ORAL_TABLET | ORAL | Status: AC
  Filled 2019-07-30: qty 1

## 2019-07-30 MED ORDER — ONDANSETRON HCL 4 MG/2ML IJ SOLN
INTRAMUSCULAR | Status: DC | PRN
  Administered 2019-07-30: 4 mg via INTRAVENOUS

## 2019-07-30 MED ORDER — LACTATED RINGERS IV SOLN
INTRAVENOUS | Status: DC
  Administered 2019-07-30 (×2): via INTRAVENOUS

## 2019-07-30 MED ORDER — FENTANYL CITRATE (PF) 100 MCG/2ML IJ SOLN
INTRAMUSCULAR | Status: DC | PRN
  Administered 2019-07-30: 50 ug via INTRAVENOUS
  Administered 2019-07-30: 100 ug via INTRAVENOUS
  Administered 2019-07-30: 50 ug via INTRAVENOUS

## 2019-07-30 SURGICAL SUPPLY — 46 items
ANCHOR TIS RET SYS 235ML (MISCELLANEOUS) ×2 IMPLANT
BAG DECANTER FOR FLEXI CONT (MISCELLANEOUS) ×3 IMPLANT
BLADE SURG 15 STRL LF DISP TIS (BLADE) ×1 IMPLANT
BLADE SURG 15 STRL SS (BLADE) ×2
BLADE SURG SZ11 CARB STEEL (BLADE) ×3 IMPLANT
CANISTER SUCT 1200ML W/VALVE (MISCELLANEOUS) ×3 IMPLANT
CATH ROBINSON RED A/P 16FR (CATHETERS) ×3 IMPLANT
CHLORAPREP W/TINT 26 (MISCELLANEOUS) ×3 IMPLANT
CLOSURE WOUND 1/2 X4 (GAUZE/BANDAGES/DRESSINGS) ×1
COVER WAND RF STERILE (DRAPES) IMPLANT
ELECT REM PT RETURN 9FT ADLT (ELECTROSURGICAL) ×3
ELECTRODE REM PT RTRN 9FT ADLT (ELECTROSURGICAL) ×1 IMPLANT
GLOVE BIOGEL PI ORTHO PRO 7.5 (GLOVE) ×2
GLOVE PI ORTHO PRO STRL 7.5 (GLOVE) ×1 IMPLANT
GLOVE SURG SYN 8.0 (GLOVE) ×3 IMPLANT
GLOVE SURG SYN 8.0 PF PI (GLOVE) ×1 IMPLANT
GOWN STRL REUS W/ TWL LRG LVL3 (GOWN DISPOSABLE) ×2 IMPLANT
GOWN STRL REUS W/TWL LRG LVL3 (GOWN DISPOSABLE) ×4
GOWN STRL REUS W/TWL XL LVL4 (GOWN DISPOSABLE) ×3 IMPLANT
HEMOSTAT SURGICEL 2X3 (HEMOSTASIS) ×2 IMPLANT
IRRIGATION STRYKERFLOW (MISCELLANEOUS) IMPLANT
IRRIGATOR STRYKERFLOW (MISCELLANEOUS) ×3
IV LACTATED RINGERS 1000ML (IV SOLUTION) ×3 IMPLANT
KIT PINK PAD W/HEAD ARE REST (MISCELLANEOUS) ×3
KIT PINK PAD W/HEAD ARM REST (MISCELLANEOUS) ×1 IMPLANT
KIT TURNOVER CYSTO (KITS) ×3 IMPLANT
LIGASURE LAP MARYLAND 5MM 37CM (ELECTROSURGICAL) ×2 IMPLANT
NEEDLE HYPO 22GX1.5 SAFETY (NEEDLE) ×3 IMPLANT
NS IRRIG 500ML POUR BTL (IV SOLUTION) ×3 IMPLANT
PACK GYN LAPAROSCOPIC (MISCELLANEOUS) ×3 IMPLANT
PAD OB MATERNITY 4.3X12.25 (PERSONAL CARE ITEMS) ×3 IMPLANT
PAD PREP 24X41 OB/GYN DISP (PERSONAL CARE ITEMS) ×3 IMPLANT
SCISSORS METZENBAUM CVD 33 (INSTRUMENTS) ×2 IMPLANT
SET TUBE SMOKE EVAC HIGH FLOW (TUBING) ×3 IMPLANT
SLEEVE ENDOPATH XCEL 5M (ENDOMECHANICALS) ×2 IMPLANT
STRIP CLOSURE SKIN 1/2X4 (GAUZE/BANDAGES/DRESSINGS) ×2 IMPLANT
SUT VICRYL 0 AB UR-6 (SUTURE) ×3 IMPLANT
SUT VICRYL 4-0  27 PS-2 BARIAT (SUTURE) ×2
SUT VICRYL 4-0 27 PS-2 BARIAT (SUTURE) ×1
SUTURE VICRYL 4-0 27 PS-2 BART (SUTURE) ×1 IMPLANT
TOWEL OR 17X26 4PK STRL BLUE (TOWEL DISPOSABLE) ×3 IMPLANT
TROCAR ENDO BLADELESS 11MM (ENDOMECHANICALS) IMPLANT
TROCAR XCEL NON-BLD 5MMX100MML (ENDOMECHANICALS) ×3 IMPLANT
TROCAR XCEL UNIV SLVE 11M 100M (ENDOMECHANICALS) IMPLANT
TUBING CONNECTING 10 (TUBING) ×2 IMPLANT
TUBING CONNECTING 10' (TUBING) ×1

## 2019-07-30 NOTE — Anesthesia Postprocedure Evaluation (Signed)
Anesthesia Post Note  Patient: [name]  Procedure(s) Performed: LAPAROSCOPY DIAGNOSTIC, RIGHT (Right ) LAPAROSCOPIC UNILATERAL SALPINGECTOMY, DRAIN PELVIC ABSCESS (Right )  Patient location during evaluation: PACU Anesthesia Type: General Level of consciousness: awake and alert Pain management: pain level controlled Vital Signs Assessment: post-procedure vital signs reviewed and stable Respiratory status: spontaneous breathing, nonlabored ventilation, respiratory function stable and patient connected to nasal cannula oxygen Cardiovascular status: blood pressure returned to baseline and stable Postop Assessment: no apparent nausea or vomiting Anesthetic complications: no     Last Vitals:  Vitals:   07/30/19 1348 07/30/19 1403  BP: 125/76 125/73  Pulse: 76 78  Resp: 16 18  Temp:  (!) 36.4 C  SpO2: 94% 94%    Last Pain:  Vitals:   07/30/19 1348  TempSrc:   PainSc: [place]

## 2019-07-30 NOTE — Interval H&P Note (Signed)
History and Physical Interval Note:  07/30/2019 10:32 AM  Charlotte Hawkins  has presented today for surgery, with the diagnosis of HEMMORRAGHIC RIGHT OVARIAN CYST.  The various methods of treatment have been discussed with the patient and family. After consideration of risks, benefits and other options for treatment, the patient has consented to  Procedure(s): LAPAROSCOPY DIAGNOSTIC, RIGHT (Right) LAPAROSCOPIC UNILATERAL SALPINGECTOMY, POSSIBLE OOPHORECTOMY, RIGHT (Right) as a surgical intervention.  The patient's history has been reviewed, patient examined, no change in status, stable for surgery.  I have reviewed the patient's chart and labs.  Questions were answered to the patient's satisfaction.     Jeannie Fend

## 2019-07-30 NOTE — Op Note (Signed)
@  LOGO@    OPERATIVE NOTE 07/30/2019 12:44 PM  PRE-OPERATIVE DIAGNOSIS:  1) HEMMORRAGHIC RIGHT OVARIAN CYST  POST-OPERATIVE DIAGNOSIS:  1) pelvic inflammatory disease with associated abscess involving the right tube 2) extensive pelvic adhesions and edema  OPERATION: Exploratory laparoscopy, right salpingectomy, drainage of pelvic abscess, lysis of adhesions, pelvic irrigation.    SURGEON(S): Surgeon(s) and Role:    Harlin Heys, MD - Primary   ANESTHESIA: General  ESTIMATED BLOOD LOSS: 100 mL  OPERATIVE FINDINGS: Edematous pelvic organs especially the adnexa.  Dilated right fallopian tube and associated cul-de-sac abscess, clubbed left fallopian tube, cul-de-sac bowel adhesions.  SPECIMEN:  ID Type Source Tests Collected by Time Destination  1 : Right fallopian tube  Tissue [facility] benign resection SURGICAL PATHOLOGY Harlin Heys, MD [id number]     COMPLICATIONS: None  DISPOSITION: Stable to recovery room  DESCRIPTION OF PROCEDURE:      The patient was prepped and draped in the dorsolithotomy position and placed under general anesthesia. The bladder was emptied. The cervix was grasped with a multi-toothed tenaculum and a uterine manipulator was placed within the cervical os respecting the position and curvature of the uterus. After changing gloves we proceeded abdominally. A small infraumbilical incision was made and a 5 mm trocar port was placed within the abdominopelvic cavity. The opening pressure was less than 7 mmHg.  Approximately 3 and 1/2 L of carbon dioxide gas was instilled within the abdominal pelvic cavity. The laparoscope was placed and the pelvis and abdomen were carefully inspected. The cul-de-sac was completely obliterated by edema and right and left fallopian tubes associated with bowel adhesions.  Using blunt dissection we were able to identify the left fallopian tube.  During the process and abscess ruptured pelvis.  We were then able to  identify both ovaries and these appeared normal.  The right fallopian tube was massively dilated and associated with the pelvic abscess.  After extensive dissection of the posterior cul-de-sac the right tube was able to be identified and was systematically removed coagulating of the antimesenteric aspect of the tube and dividing the tube.  Additional sections of the abscess were noted in the cul-de-sac and the wall of this was removed with the tube.  We then extensively irrigated the pelvis and sucked out all the free fluid.  Large areas of raw tissue were noted in the pelvis and these had some very slow oozing of blood.  Mesh was placed in the cul-de-sac with coagulant to assist with hemostasis of these raw areas.  The remainder of the dissected areas were carefully inspected and no bleeding was noted.  Using an Endo Catch bag the tube was placed in the bag and removed from the pelvis through the left lower quadrant port. Hemostasis of all areas of the pelvis was noted. The ports were removed under direct visualization.  Hemostasis was noted.  The laparoscope was removed the trocar sleeve was removed and the incision was closed with a deep suture through the fascia of 0 Vicryl followed by subcuticular closure of the skin for all port sites. A long-acting anesthetic was injected.  Steri-Strips were applied. The uterine manipulator was removed. Hemostasis of the cervix was noted. The patient went to the recovery room in stable condition.  Finis Bud, M.D. 07/30/2019 12:44 PM

## 2019-07-30 NOTE — Anesthesia Procedure Notes (Signed)
Procedure Name: Intubation Date/Time: 07/30/2019 10:51 AM Performed by: Rona Ravens, CRNA Pre-anesthesia Checklist: Patient identified, Emergency Drugs available, Patient being monitored, Suction available and Timeout performed Patient Re-evaluated:Patient Re-evaluated prior to induction Oxygen Delivery Method: Circle system utilized Preoxygenation: Pre-oxygenation with 100% oxygen Induction Type: IV induction and Rapid sequence Grade View: Grade I Tube type: Oral Tube size: 7.0 mm Number of attempts: 1 Airway Equipment and Method: Stylet Placement Confirmation: ETT inserted through vocal cords under direct vision,  positive ETCO2,  CO2 detector and breath sounds checked- equal and bilateral Secured at: 21 cm Tube secured with: Tape Dental Injury: Teeth and Oropharynx as per pre-operative assessment

## 2019-07-30 NOTE — Anesthesia Preprocedure Evaluation (Addendum)
Anesthesia Evaluation  Patient identified by MRN, date of birth, ID band Patient awake    Reviewed: Allergy & Precautions, NPO status , Patient's Chart, lab work & pertinent test results, reviewed documented beta blocker date and time   Airway Mallampati: III  TM Distance: >3 FB     Dental  (+) Chipped, Partial Lower, Partial Upper   Pulmonary Current Smoker,           Cardiovascular      Neuro/Psych PSYCHIATRIC DISORDERS Anxiety Depression    GI/Hepatic   Endo/Other    Renal/GU      Musculoskeletal   Abdominal   Peds  Hematology   Anesthesia Other Findings Drug use. Jaw is ok now. Can open mouth ok.  Reproductive/Obstetrics                            Anesthesia Physical Anesthesia Plan  ASA: III  Anesthesia Plan: General   Post-op Pain Management:    Induction: Intravenous  PONV Risk Score and Plan:   Airway Management Planned: Oral ETT  Additional Equipment:   Intra-op Plan:   Post-operative Plan:   Informed Consent: I have reviewed the patients History and Physical, chart, labs and discussed the procedure including the risks, benefits and alternatives for the proposed anesthesia with the patient or authorized representative who has indicated his/her understanding and acceptance.       Plan Discussed with: CRNA  Anesthesia Plan Comments:         Anesthesia Quick Evaluation

## 2019-07-30 NOTE — Discharge Instructions (Addendum)
Salpingectomy, Care After This sheet gives you information about how to care for yourself after your procedure. Your health care provider may also give you more specific instructions. If you have problems or questions, contact your health care provider. What can I expect after the procedure? After the procedure, it is common to have:  Pain in your abdomen.  Some light vaginal bleeding (spotting) for a few days.  Tiredness. Your recovery time will vary depending on which method your surgeon used for your surgery. Follow these instructions at home: Incision care   Follow instructions from your health care provider about how to take care of your incisions. Make sure you: ? Wash your hands with soap and water before and after you change your bandage (dressing). If soap and water are not available, use hand sanitizer. ? Change or remove your dressing as told by your health care provider. ? Leave any stitches (sutures), skin glue, or adhesive strips in place. These skin closures may need to stay in place for 2 weeks or longer. If adhesive strip edges start to loosen and curl up, you may trim the loose edges. Do not remove adhesive strips completely unless your health care provider tells you to do that.  Keep your dressing clean and dry.  Check your incision area every day for signs of infection. Check for: ? Redness, swelling, or pain that gets worse. ? Fluid or blood. ? Warmth. ? Pus or a bad smell. Activity  Rest as told by your health care provider.  Avoid sitting for a long time without moving. Get up to take short walks every 1-2 hours. This is important to improve blood flow and breathing. Ask for help if you feel weak or unsteady.  Return to your normal activities as told by your health care provider. Ask your health care provider what activities are safe for you.  Do not drive until your health care provider says that it is safe.  Do not lift anything that is heavier than 10 lb  (4.5 kg), or the limit that you are told, until your health care provider says that it is safe. This may be 2-6 weeks depending on your surgery.  Until your health care provider approves: ? Do not douche. ? Do not use tampons. ? Do not have sex. Medicines  Take over-the-counter and prescription medicines only as told by your health care provider.  Ask your health care provider if the medicine prescribed to you: ? Requires you to avoid driving or using heavy machinery. ? Can cause constipation. You may need to take actions to prevent or treat constipation, such as:  Drink enough fluid to keep your urine pale yellow.  Take over-the-counter or prescription medicines.  Eat foods that are high in fiber, such as beans, whole grains, and fresh fruits and vegetables.  Limit foods that are high in fat and processed sugars, such as fried or sweet foods. General instructions  Wear compression stockings as told by your health care provider. These stockings help to prevent blood clots and reduce swelling in your legs.  Do not use any products that contain nicotine or tobacco, such as cigarettes, e-cigarettes, and chewing tobacco. If you need help quitting, ask your health care provider.  Do not take baths, swim, or use a hot tub until your health care provider approves. You may take showers.  Keep all follow-up visits as told by your health care provider. This is important. Contact a health care provider if you have:  Pain  when you urinate.  Redness, swelling, or pain around an incision.  Fluid or blood coming from an incision.  Pus or a bad smell coming from an incision.  An incision that feels warm to the touch.  A fever.  Abdominal pain that gets worse or does not get better with medicine.  An incision that starts to break open.  A rash.  Light-headedness.  Nausea and vomiting. Get help right away if you:  Have pain in your chest or leg.  Develop shortness of  breath.  Faint.  Have increased or heavy vaginal bleeding, such as soaking a pad in an hour. Summary  After the procedure, it is common to feel tired, have some pain in your abdomen, and have some light vaginal bleeding for a few days.  Follow instructions from your health care provider about how to take care of your incisions.  Return to your normal activities as told by your health care provider. Ask your health care provider what activities are safe for you.  Do not douche, use tampons, or have sex until your health care provider approves.  Keep all follow-up visits as told by your health care provider. This information is not intended to replace advice given to you by your health care provider. Make sure you discuss any questions you have with your health care provider. Document Released: 02/19/2011 Document Revised: 11/06/2018 Document Reviewed: 11/06/2018 Elsevier Patient Education  2020 [employer].

## 2019-07-30 NOTE — Anesthesia Post-op Follow-up Note (Signed)
Anesthesia QCD[facility] form completed.        

## 2019-07-30 NOTE — Transfer of Care (Signed)
Immediate Anesthesia Transfer of Care Note  Patient: Charlotte Hawkins  Procedure(s) Performed: LAPAROSCOPY DIAGNOSTIC, RIGHT (Right ) LAPAROSCOPIC UNILATERAL SALPINGECTOMY, POSSIBLE OOPHORECTOMY, RIGHT (Right )  Patient Location: PACU  Anesthesia Type:General  Level of Consciousness: awake and alert   Airway & Oxygen Therapy: Patient Spontanous Breathing and Patient connected to face mask oxygen  Post-op Assessment: Report given to RN and Post -op Vital signs reviewed and stable  Post vital signs: Reviewed and stable  Last Vitals:  Vitals Value Taken Time  BP 134/61 07/30/19 1233  Temp 36.7 C 07/30/19 1233  Pulse 88 07/30/19 1236  Resp 25 07/30/19 1236  SpO2 100 % 07/30/19 1236  Vitals shown include unvalidated device data.  Last Pain:  Vitals:   07/30/19 0919  TempSrc: Temporal  PainSc: 9          Complications: No apparent anesthesia complications

## 2019-07-31 LAB — GC/CHLAMYDIA PROBE AMP
Chlamydia trachomatis, NAA: NEGATIVE
Neisseria Gonorrhoeae by PCR: POSITIVE — AB

## 2019-07-31 LAB — SURGICAL PATHOLOGY

## 2019-08-07 ENCOUNTER — Telehealth: Payer: Self-pay

## 2019-08-07 ENCOUNTER — Telehealth: Payer: Self-pay | Admitting: Surgical

## 2019-08-07 NOTE — Telephone Encounter (Signed)
Pt is returning a call  

## 2019-08-07 NOTE — Telephone Encounter (Signed)
LM for patient to return call to come in this week for post op and STD treatment.

## 2019-08-07 NOTE — Telephone Encounter (Signed)
Scheduled patient to come in.

## 2019-08-09 ENCOUNTER — Ambulatory Visit: Payer: Medicaid Other | Admitting: Obstetrics and Gynecology

## 2019-08-10 ENCOUNTER — Encounter: Payer: Self-pay | Admitting: Obstetrics and Gynecology

## 2019-08-10 ENCOUNTER — Other Ambulatory Visit: Payer: Self-pay

## 2019-08-10 ENCOUNTER — Ambulatory Visit (INDEPENDENT_AMBULATORY_CARE_PROVIDER_SITE_OTHER): Payer: Medicaid Other | Admitting: Obstetrics and Gynecology

## 2019-08-10 VITALS — BP 130/77 | HR 80 | Ht 62.5 in | Wt 191.1 lb

## 2019-08-10 DIAGNOSIS — A549 Gonococcal infection, unspecified: Secondary | ICD-10-CM

## 2019-08-10 DIAGNOSIS — Z9889 Other specified postprocedural states: Secondary | ICD-10-CM

## 2019-08-10 MED ORDER — CEFTRIAXONE SODIUM 250 MG IJ SOLR
250.0000 mg | Freq: Once | INTRAMUSCULAR | Status: AC
  Administered 2019-08-10: 250 mg via INTRAMUSCULAR

## 2019-08-10 NOTE — Addendum Note (Signed)
Addended by: Durwin Glaze on: 08/10/2019 04:26 PM   Modules accepted: Orders

## 2019-08-10 NOTE — Progress Notes (Signed)
Patient comes in today for post op appointment. Patient states that she is doing good,

## 2019-08-10 NOTE — Progress Notes (Signed)
HPI:      [name] is a 33 y.o. [phone] who LMP was Patient's last menstrual period was 07/17/2019.  Subjective:   She presents today for postop appointment after right salpingectomy drainage of pelvic abscess and lysis of adhesions.  She is doing well has resumed normal activities without incident.  She says her pain is resolved and she feels well.  She has been taking her antibiotics as directed.    Hx: The following portions of the patient's history were reviewed and updated as appropriate:             She  has a past medical history of Anxiety, Constipation, Depression, and Ovarian cyst. She does not have a problem list on file. She  has a past surgical history that includes Mandible fracture surgery; laparoscopy (Right, 07/30/2019); and Laparoscopic unilateral salpingectomy (Right, 07/30/2019). Her family history includes Heart disease in her mother. She  reports that she has been smoking. She has never used smokeless tobacco. She reports current alcohol use. She reports current drug use. Frequency: 3.00 times per week. Drug: Marijuana. She has a current medication list which includes the following prescription(s): fluoxetine, gabapentin, levofloxacin, and metronidazole. She is allergic to bee venom and pepto-bismol [bismuth subsalicylate].       Review of Systems:  Review of Systems  Constitutional: Denied constitutional symptoms, night sweats, recent illness, fatigue, fever, insomnia and weight loss.  Eyes: Denied eye symptoms, eye pain, photophobia, vision change and visual disturbance.  Ears/Nose/Throat/Neck: Denied ear, nose, throat or neck symptoms, hearing loss, nasal discharge, sinus congestion and sore throat.  Cardiovascular: Denied cardiovascular symptoms, arrhythmia, chest pain/pressure, edema, exercise intolerance, orthopnea and palpitations.  Respiratory: Denied pulmonary symptoms, asthma, pleuritic pain, productive sputum, cough, dyspnea and wheezing.   Gastrointestinal: Denied, gastro-esophageal reflux, melena, nausea and vomiting.  Genitourinary: Denied genitourinary symptoms including symptomatic vaginal discharge, pelvic relaxation issues, and urinary complaints.  Musculoskeletal: Denied musculoskeletal symptoms, stiffness, swelling, muscle weakness and myalgia.  Dermatologic: Denied dermatology symptoms, rash and scar.  Neurologic: Denied neurology symptoms, dizziness, headache, neck pain and syncope.  Psychiatric: Denied psychiatric symptoms, anxiety and depression.  Endocrine: Denied endocrine symptoms including hot flashes and night sweats.   Meds:   Current Outpatient Medications on File Prior to Visit  Medication Sig Dispense Refill  . FLUoxetine (PROZAC) 20 MG tablet Take 20 mg by mouth 2 (two) times daily.    Marland Kitchen gabapentin (NEURONTIN) 300 MG capsule Take 300 mg by mouth at bedtime.     Marland Kitchen levofloxacin (LEVAQUIN) 500 MG tablet Take 1 tablet (500 mg total) by mouth daily for 14 days. 14 tablet 0  . metroNIDAZOLE (FLAGYL) 500 MG tablet Take 1 tablet (500 mg total) by mouth 2 (two) times daily for 14 days. 28 tablet 0   No current facility-administered medications on file prior to visit.     Objective:     Vitals:   08/10/19 1136  BP: 130/77  Pulse: 80               Abdomen: Soft.  Non-tender.  No masses.  No HSM.  Incision/s: Intact.  Healing well.  No erythema.  No drainage.   Positive gonorrhea reviewed with the patient   Assessment:    [id number] There are no active problems to display for this patient.    1. Post-operative state   2. Gonorrhea     Likely causing PID and abscess formation as noted at surgery   Plan:  1.  Expect that antibiotics given at the time of surgery should treat gonorrhea however will doubly make sure by giving an IM dose of Rocephin today.  2.  Discussed gonorrhea in detail with the patient she is aware that she must tell all of her sexual partners and they need to be  treated before resumption of intercourse  3.  Relationship of PID to gonorrhea.  Tubal damage discussed in light of the fact that her left tube appeared clubbed at the time of surgery.  All questions answered. Orders No orders of the defined types were placed in this encounter.   No orders of the defined types were placed in this encounter.     F/U  Return in about 1 week (around 08/17/2019).  Elonda Huskyavid J. Evans, M.D. 08/10/2019 12:34 PM

## 2019-08-13 ENCOUNTER — Telehealth: Payer: Self-pay | Admitting: Obstetrics and Gynecology

## 2019-08-13 NOTE — Telephone Encounter (Signed)
[facility] faxed form requesting pt status/ pulled pt chart to confirm provider. Fax placed in provider basket. Thank you.

## 2019-08-13 NOTE — Telephone Encounter (Signed)
Form was faxed to [facility] this morning. Done

## 2019-08-15 ENCOUNTER — Encounter: Payer: Medicaid Other | Admitting: Obstetrics and Gynecology

## 2019-08-15 ENCOUNTER — Encounter: Payer: Medicaid Other | Admitting: Certified Nurse Midwife

## 2019-08-27 ENCOUNTER — Telehealth: Payer: Self-pay

## 2019-08-27 NOTE — Telephone Encounter (Signed)
LM for patient to return call.

## 2019-08-27 NOTE — Telephone Encounter (Signed)
Pt is having a lot of itching and burning in the vaginal area. + for redness  Unsure if she has d/c due to vb.   Pls advise

## 2019-08-28 ENCOUNTER — Other Ambulatory Visit: Payer: Medicaid Other

## 2019-08-28 ENCOUNTER — Ambulatory Visit (INDEPENDENT_AMBULATORY_CARE_PROVIDER_SITE_OTHER): Payer: Medicaid Other

## 2019-08-28 ENCOUNTER — Other Ambulatory Visit: Payer: Self-pay

## 2019-08-28 DIAGNOSIS — R102 Pelvic and perineal pain: Secondary | ICD-10-CM | POA: Diagnosis not present

## 2019-08-28 DIAGNOSIS — N83201 Unspecified ovarian cyst, right side: Secondary | ICD-10-CM | POA: Diagnosis not present

## 2019-08-30 NOTE — Telephone Encounter (Signed)
LM to return call.

## 2019-09-03 ENCOUNTER — Telehealth: Payer: Self-pay | Admitting: Obstetrics and Gynecology

## 2019-09-03 ENCOUNTER — Emergency Department
Admission: EM | Admit: 2019-09-03 | Discharge: 2019-09-03 | Disposition: A | Discharge status: Home / Self Care | Payer: Medicaid Other | Attending: Emergency Medicine | Admitting: Emergency Medicine

## 2019-09-03 ENCOUNTER — Emergency Department: Payer: Medicaid Other

## 2019-09-03 ENCOUNTER — Encounter: Payer: Self-pay | Admitting: *Deleted

## 2019-09-03 ENCOUNTER — Other Ambulatory Visit: Payer: Self-pay

## 2019-09-03 DIAGNOSIS — R1030 Lower abdominal pain, unspecified: Secondary | ICD-10-CM | POA: Diagnosis present

## 2019-09-03 DIAGNOSIS — F17210 Nicotine dependence, cigarettes, uncomplicated: Secondary | ICD-10-CM | POA: Insufficient documentation

## 2019-09-03 LAB — COMPREHENSIVE METABOLIC PANEL
ALT: 18 U/L (ref 0–44)
AST: 21 U/L (ref 15–41)
Albumin: 3.9 g/dL (ref 3.5–5.0)
Alkaline Phosphatase: 66 U/L (ref 38–126)
Anion gap: 8 (ref 5–15)
BUN: 8 mg/dL (ref 6–20)
CO2: 23 mmol/L (ref 22–32)
Calcium: 8.9 mg/dL (ref 8.9–10.3)
Chloride: 107 mmol/L (ref 98–111)
Creatinine, Ser: 0.7 mg/dL (ref 0.44–1.00)
GFR calc Af Amer: >60 mL/min (ref >60)
GFR calc non Af Amer: >60 mL/min (ref >60)
Glucose, Bld: 116 mg/dL — ABNORMAL HIGH (ref 70–99)
Potassium: 3.5 mmol/L (ref 3.5–5.1)
Sodium: 138 mmol/L (ref 135–145)
Total Bilirubin: 0.8 mg/dL (ref 0.3–1.2)
Total Protein: 7.4 g/dL (ref 6.5–8.1)

## 2019-09-03 LAB — POCT PREGNANCY, URINE: Preg Test, Ur: NEGATIVE

## 2019-09-03 LAB — URINALYSIS, COMPLETE (UACMP) WITH MICROSCOPIC
Bacteria, UA: NONE SEEN
Bilirubin Urine: NEGATIVE
Glucose, UA: NEGATIVE mg/dL
Hgb urine dipstick: NEGATIVE
Ketones, ur: NEGATIVE mg/dL
Leukocytes,Ua: NEGATIVE
Nitrite: NEGATIVE
Protein, ur: 30 mg/dL — AB
Specific Gravity, Urine: 1.027 (ref 1.005–1.030)
pH: 5 (ref 5.0–8.0)

## 2019-09-03 LAB — CBC
HCT: 31.5 % — ABNORMAL LOW (ref 36.0–46.0)
Hemoglobin: 9.6 g/dL — ABNORMAL LOW (ref 12.0–15.0)
MCH: 23.9 pg — ABNORMAL LOW (ref 26.0–34.0)
MCHC: 30.5 g/dL (ref 30.0–36.0)
MCV: 78.4 fL — ABNORMAL LOW (ref 80.0–100.0)
Platelets: 296 10*3/uL (ref 150–400)
RBC: 4.02 MIL/uL (ref 3.87–5.11)
RDW: 18 % — ABNORMAL HIGH (ref 11.5–15.5)
WBC: 8.1 10*3/uL (ref 4.0–10.5)
nRBC: 0 % (ref 0.0–0.2)

## 2019-09-03 LAB — LIPASE, BLOOD: Lipase: 22 U/L (ref 11–51)

## 2019-09-03 LAB — WET PREP, GENITAL
Clue Cells Wet Prep HPF POC: NONE SEEN
Sperm: NONE SEEN
Trich, Wet Prep: NONE SEEN
Yeast Wet Prep HPF POC: NONE SEEN

## 2019-09-03 MED ORDER — HYDROMORPHONE HCL 1 MG/ML IJ SOLN
1.0000 mg | Freq: Once | INTRAMUSCULAR | Status: AC
  Administered 2019-09-03: 1 mg via INTRAMUSCULAR
  Filled 2019-09-03: qty 1

## 2019-09-03 MED ORDER — DOXYCYCLINE HYCLATE 100 MG PO TABS: 100.0000 mg | ORAL_TABLET | Freq: Two times a day (BID) | ORAL | 0 refills | Status: AC

## 2019-09-03 MED ORDER — OXYCODONE-ACETAMINOPHEN 5-325 MG PO TABS: 1.0000 | ORAL_TABLET | ORAL | 0 refills | Status: AC | PRN

## 2019-09-03 MED ORDER — SODIUM CHLORIDE 0.9 % IV SOLN
100.0000 mg | Freq: Once | INTRAVENOUS | Status: DC
  Filled 2019-09-03: qty 100

## 2019-09-03 MED ORDER — DOXYCYCLINE HYCLATE 100 MG PO TABS
100.0000 mg | ORAL_TABLET | Freq: Once | ORAL | Status: AC
  Administered 2019-09-03: 100 mg via ORAL

## 2019-09-03 MED ORDER — ONDANSETRON 4 MG PO TBDP
4.0000 mg | ORAL_TABLET | Freq: Once | ORAL | Status: AC | PRN
  Administered 2019-09-03: 4 mg via ORAL
  Filled 2019-09-03: qty 1

## 2019-09-03 MED ORDER — SODIUM CHLORIDE 0.9 % IV SOLN
2.0000 g | Freq: Once | INTRAVENOUS | Status: DC
  Filled 2019-09-03: qty 2

## 2019-09-03 MED ORDER — HYDROMORPHONE HCL 1 MG/ML IJ SOLN
1.0000 mg | Freq: Once | INTRAMUSCULAR | Status: AC
  Administered 2019-09-03: 1 mg via INTRAVENOUS
  Filled 2019-09-03: qty 1

## 2019-09-03 NOTE — ED Notes (Signed)
Patient transported to Ultrasound 

## 2019-09-03 NOTE — Telephone Encounter (Signed)
Patient called and stated that she is in the same pain she was prior to her surgery. Pt is requesting a call back from her nurse and is also requesting an appointment. Please advise.

## 2019-09-03 NOTE — ED Triage Notes (Signed)
Pt to ED reporting right sided abd pain with pelvic. Vaginal bleeding reported x 2 weeks with the bleeding stopping yesterday. Pt has been feeling weak with decreased appetite and nausea as well. No fevers.   Aug 29th pt had surgery to remove right ovary but pt reports the pain feels "very similar" to the pain she was having before surgery.

## 2019-09-03 NOTE — ED Provider Notes (Signed)
[facility] Emergency Department Provider Note  Time seen: 4:36 PM  I have reviewed the triage vital signs and the nursing notes.   HISTORY  Chief Complaint Abdominal Pain   HPI Charlotte Hawkins is a 33 y.o. female with a past medical history of anxiety, depression, surgery 07/28/2019 for tubo-ovarian abscess presents to the emergency department for lower abdominal pain.  According to the patient over the past week or so she has been experiencing intermittent lower abdominal pain that has acutely worsened over the past 24 hours.  Patient states 10/10 aching/twisting lower abdominal pain.  States she has been experiencing intermittent vaginal bleeding ever since her surgery but denies any significant discharge.  Denies any fever.  No cough or shortness of breath.  No upper abdominal pain nausea vomiting or diarrhea.  Past Medical History:  Diagnosis Date  . Anxiety   . Constipation   . Depression   . Ovarian cyst     There are no active problems to display for this patient.   Past Surgical History:  Procedure Laterality Date  . LAPAROSCOPIC UNILATERAL SALPINGECTOMY Right 07/30/2019   Procedure: LAPAROSCOPIC UNILATERAL SALPINGECTOMY, DRAIN PELVIC ABSCESS;  Surgeon: Linzie Collin, MD;  Location: [facility] ORS;  Service: Gynecology;  Laterality: Right;  . LAPAROSCOPY Right 07/30/2019   Procedure: LAPAROSCOPY DIAGNOSTIC, RIGHT;  Surgeon: Linzie Collin, MD;  Location: [facility] ORS;  Service: Gynecology;  Laterality: Right;  . MANDIBLE FRACTURE SURGERY      Prior to Admission medications   Medication Sig Start Date End Date Taking? Authorizing Provider  FLUoxetine (PROZAC) 20 MG tablet Take 20 mg by mouth 2 (two) times daily. 10/19/18   [provider]  gabapentin (NEURONTIN) 300 MG capsule Take 300 mg by mouth at bedtime.  12/31/15   [provider]    Allergies  Allergen Reactions  . Bee Venom Swelling  . Pepto-Bismol [Bismuth Subsalicylate]  Other (See Comments)    Turns mouth black     Family History  Problem Relation Age of Onset  . Heart disease Mother   . Breast cancer Neg Hx   . Ovarian cancer Neg Hx   . Colon cancer Neg Hx   . Diabetes Neg Hx     Social History Social History   Tobacco Use  . Smoking status: Current Every Day Smoker    Packs/day: 0.20    Types: Cigarettes  . Smokeless tobacco: Never Used  Substance Use Topics  . Alcohol use: Yes    Comment: rare  . Drug use: Yes    Frequency: 3.0 times per week    Types: Marijuana    Comment: hx of positive cocaine     Review of Systems Constitutional: Negative for fever. Cardiovascular: Negative for chest pain. Respiratory: Negative for shortness of breath. Gastrointestinal: Positive for lower abdominal pain.  Negative for vomiting or diarrhea Genitourinary: Negative for urinary compaints.  Negative for discharge positive for intermittent vaginal spotting. Musculoskeletal: Negative for musculoskeletal complaints Skin: Negative for skin complaints  Neurological: Negative for headache All other ROS negative  ____________________________________________   PHYSICAL EXAM:  VITAL SIGNS: ED Triage Vitals [09/03/19 1514]  Enc Vitals Group     BP 139/75     Pulse Rate 88     Resp 16     Temp 98.6 F (37 C)     Temp Source Oral     SpO2 98 %     Weight 190 lb (86.2 kg)     Height 5\' 2"  (  1.575 m)     Head Circumference      Peak Flow      Pain Score 10     Pain Loc      Pain Edu?      Excl. in [place]?    Constitutional: Alert and oriented. Well appearing and in no distress. Eyes: Normal exam ENT      Head: Normocephalic and atraumatic.      Mouth/Throat: Mucous membranes are moist. Cardiovascular: Normal rate, regular rhythm.  Respiratory: Normal respiratory effort without tachypnea nor retractions. Breath sounds are clear Gastrointestinal: Soft and nontender. No distention.  Musculoskeletal: Nontender with normal range of motion in all  extremities.  Neurologic:  Normal speech and language. No gross focal neurologic deficits Skin:  Skin is warm, dry and intact.  Psychiatric: Mood and affect are normal.   ____________________________________________   INITIAL IMPRESSION / ASSESSMENT AND PLAN / ED COURSE  Pertinent labs & imaging results that were available during my care of the patient were reviewed by me and considered in my medical decision making (see chart for details).   Patient presents to the emergency department for lower abdominal discomfort.  Differential would include pelvic infection, PID or tubo-ovarian abscess, malpositioned IUD, UTI pyelonephritis. Patient's labs are largely within normal limits including a normal urinalysis, blood work is largely within normal limits as well pregnancy test is negative.  We will perform a pelvic exam, send a GC/chlamydia test, wet prep and likely proceed with ultrasound of the pelvis to further evaluate.  Patient agreeable to plan of care.  Patient's labs have resulted largely within normal limits, no white blood cell count.  No fever.  Patient's ultrasound has resulted showing a fluid collection along the right adnexa.  Somewhat increased from 08/28/2019.  I discussed the patient with Dr. Amalia Hailey who performed the patient's surgery 07/28/2019.  He has reviewed the ultrasound images, does not believe this to be an active infection or least one that would require any type of intervention at this time.  Patient's pain is much controlled after 2 rounds of pain medication.  Patient has a follow-up appointment with Dr. Amalia Hailey at 10:00 tomorrow morning.  We will place the patient back on doxycycline, Percocet for pain control and have her follow-up with Dr. Amalia Hailey.  I discussed return precautions for worsening pain or development of fever.  Patient agreeable to plan of care.  Charlotte Hawkins was evaluated in Emergency Department on 09/03/2019 for the symptoms described in the history of present  illness. She was evaluated in the context of the global COVID-19 pandemic, which necessitated consideration that the patient might be at risk for infection with the SARS-CoV-2 virus that causes COVID-19. Institutional protocols and algorithms that pertain to the evaluation of patients at risk for COVID-19 are in a state of rapid change based on information released by regulatory bodies including the CDC and federal and state organizations. These policies and algorithms were followed during the patient's care in the ED.  ____________________________________________   FINAL CLINICAL IMPRESSION(S) / ED DIAGNOSES  Lower abdominal pain   Harvest Dark, MD 09/03/19 1935

## 2019-09-03 NOTE — Telephone Encounter (Signed)
Spoke with patient and she is still in a lot of pain. She is going to the ED today. I have scheduled her with Dr. Amalia Hailey tomorrow. I have called Dr. Amalia Hailey to let him know what was going on with the patient.

## 2019-09-04 ENCOUNTER — Ambulatory Visit: Payer: Medicaid Other | Admitting: Obstetrics and Gynecology

## 2019-09-06 ENCOUNTER — Telehealth: Payer: Self-pay | Admitting: Obstetrics and Gynecology

## 2019-09-06 LAB — GC/CHLAMYDIA PROBE AMP
Chlamydia trachomatis, NAA: NEGATIVE
Neisseria Gonorrhoeae by PCR: NEGATIVE

## 2019-09-06 NOTE — Telephone Encounter (Signed)
Pt called and stated that she has been admitted to [facility]. Please advise.

## 2019-09-07 ENCOUNTER — Ambulatory Visit: Payer: Medicaid Other | Admitting: Obstetrics and Gynecology

## 2019-09-07 MED ORDER — GENERIC EXTERNAL MEDICATION
2.00 | Status: DC
Start: 2019-09-07 — End: 2019-09-07

## 2019-09-07 MED ORDER — MORPHINE SULFATE (PF) 2 MG/ML IV SOLN
2.00 | INTRAVENOUS | Status: DC
Start: ? — End: 2019-09-07

## 2019-09-07 MED ORDER — OXYCODONE-ACETAMINOPHEN 5-325 MG PO TABS
1.00 | ORAL_TABLET | ORAL | Status: DC
Start: ? — End: 2019-09-07

## 2019-09-07 MED ORDER — DOXYCYCLINE MONOHYDRATE 100 MG PO CAPS
100.00 | ORAL_CAPSULE | ORAL | Status: DC
Start: 2019-09-07 — End: 2019-09-07

## 2019-09-07 MED ORDER — FAMOTIDINE 20 MG/2ML IV SOLN
20.00 | INTRAVENOUS | Status: DC
Start: 2019-09-07 — End: 2019-09-07

## 2019-09-07 MED ORDER — ONDANSETRON HCL 4 MG/2ML IJ SOLN
4.00 | INTRAMUSCULAR | Status: DC
Start: ? — End: 2019-09-07

## 2019-09-07 MED ORDER — ZOLPIDEM TARTRATE 5 MG PO TABS
10.00 | ORAL_TABLET | ORAL | Status: DC
Start: ? — End: 2019-09-07

## 2019-09-07 MED ORDER — FLUOXETINE HCL 20 MG PO CAPS
40.00 | ORAL_CAPSULE | ORAL | Status: DC
Start: 2019-09-07 — End: 2019-09-07

## 2019-09-07 MED ORDER — INFLUENZA VAC SPLIT QUAD 0.5 ML IM SUSY
1.00 | PREFILLED_SYRINGE | INTRAMUSCULAR | Status: DC
Start: ? — End: 2019-09-07

## 2019-09-07 MED ORDER — GENERIC EXTERNAL MEDICATION
12.50 | Status: DC
Start: ? — End: 2019-09-07

## 2019-09-07 MED ORDER — OXYCODONE-ACETAMINOPHEN 5-325 MG PO TABS
2.00 | ORAL_TABLET | ORAL | Status: DC
Start: ? — End: 2019-09-07

## 2019-09-07 NOTE — Telephone Encounter (Signed)
Discussed with Dr. Amalia Hailey

## 2019-09-10 ENCOUNTER — Encounter: Payer: Self-pay | Admitting: Emergency Medicine

## 2019-09-10 ENCOUNTER — Emergency Department
Admission: EM | Admit: 2019-09-10 | Discharge: 2019-09-11 | Disposition: A | Discharge status: Home / Self Care | Payer: Medicaid Other | Attending: Emergency Medicine | Admitting: Emergency Medicine

## 2019-09-10 ENCOUNTER — Other Ambulatory Visit: Payer: Self-pay

## 2019-09-10 ENCOUNTER — Emergency Department: Payer: Medicaid Other

## 2019-09-10 DIAGNOSIS — R109 Unspecified abdominal pain: Secondary | ICD-10-CM | POA: Diagnosis present

## 2019-09-10 DIAGNOSIS — Z79899 Other long term (current) drug therapy: Secondary | ICD-10-CM | POA: Diagnosis not present

## 2019-09-10 DIAGNOSIS — N83202 Unspecified ovarian cyst, left side: Secondary | ICD-10-CM

## 2019-09-10 DIAGNOSIS — F17210 Nicotine dependence, cigarettes, uncomplicated: Secondary | ICD-10-CM | POA: Insufficient documentation

## 2019-09-10 LAB — LIPASE, BLOOD: Lipase: 23 U/L (ref 11–51)

## 2019-09-10 LAB — CBC WITH DIFFERENTIAL/PLATELET
Abs Immature Granulocytes: 0.01 10*3/uL (ref 0.00–0.07)
Basophils Absolute: 0 10*3/uL (ref 0.0–0.1)
Basophils Relative: 0 %
Eosinophils Absolute: 0 10*3/uL (ref 0.0–0.5)
Eosinophils Relative: 0 %
HCT: 33.9 % — ABNORMAL LOW (ref 36.0–46.0)
Hemoglobin: 10.2 g/dL — ABNORMAL LOW (ref 12.0–15.0)
Immature Granulocytes: 0 %
Lymphocytes Relative: 51 %
Lymphs Abs: 4 10*3/uL (ref 0.7–4.0)
MCH: 23.5 pg — ABNORMAL LOW (ref 26.0–34.0)
MCHC: 30.1 g/dL (ref 30.0–36.0)
MCV: 78.1 fL — ABNORMAL LOW (ref 80.0–100.0)
Monocytes Absolute: 0.5 10*3/uL (ref 0.1–1.0)
Monocytes Relative: 7 %
Neutro Abs: 3.3 10*3/uL (ref 1.7–7.7)
Neutrophils Relative %: 42 %
Platelets: 349 10*3/uL (ref 150–400)
RBC: 4.34 MIL/uL (ref 3.87–5.11)
RDW: 18.2 % — ABNORMAL HIGH (ref 11.5–15.5)
WBC: 7.9 10*3/uL (ref 4.0–10.5)
nRBC: 0 % (ref 0.0–0.2)

## 2019-09-10 LAB — COMPREHENSIVE METABOLIC PANEL
ALT: 13 U/L (ref 0–44)
AST: 19 U/L (ref 15–41)
Albumin: 4.3 g/dL (ref 3.5–5.0)
Alkaline Phosphatase: 66 U/L (ref 38–126)
Anion gap: 11 (ref 5–15)
BUN: 7 mg/dL (ref 6–20)
CO2: 21 mmol/L — ABNORMAL LOW (ref 22–32)
Calcium: 9.1 mg/dL (ref 8.9–10.3)
Chloride: 107 mmol/L (ref 98–111)
Creatinine, Ser: 0.71 mg/dL (ref 0.44–1.00)
GFR calc Af Amer: >60 mL/min (ref >60)
GFR calc non Af Amer: >60 mL/min (ref >60)
Glucose, Bld: 94 mg/dL (ref 70–99)
Potassium: 3.4 mmol/L — ABNORMAL LOW (ref 3.5–5.1)
Sodium: 139 mmol/L (ref 135–145)
Total Bilirubin: 0.6 mg/dL (ref 0.3–1.2)
Total Protein: 7.8 g/dL (ref 6.5–8.1)

## 2019-09-10 LAB — POCT PREGNANCY, URINE: Preg Test, Ur: NEGATIVE

## 2019-09-10 LAB — URINALYSIS, COMPLETE (UACMP) WITH MICROSCOPIC
Bacteria, UA: NONE SEEN
Bilirubin Urine: NEGATIVE
Glucose, UA: NEGATIVE mg/dL
Hgb urine dipstick: NEGATIVE
Ketones, ur: NEGATIVE mg/dL
Leukocytes,Ua: NEGATIVE
Nitrite: NEGATIVE
Protein, ur: NEGATIVE mg/dL
Specific Gravity, Urine: 1.026 (ref 1.005–1.030)
pH: 5 (ref 5.0–8.0)

## 2019-09-10 MED ORDER — SODIUM CHLORIDE 0.9 % IV BOLUS
1000.0000 mL | Freq: Once | INTRAVENOUS | Status: AC
  Administered 2019-09-10: 1000 mL via INTRAVENOUS

## 2019-09-10 MED ORDER — HALOPERIDOL LACTATE 5 MG/ML IJ SOLN
2.0000 mg | Freq: Once | INTRAMUSCULAR | Status: AC
  Administered 2019-09-10: 2 mg via INTRAVENOUS
  Filled 2019-09-10: qty 1

## 2019-09-10 MED ORDER — ONDANSETRON 4 MG PO TBDP
4.0000 mg | ORAL_TABLET | Freq: Once | ORAL | Status: AC
  Administered 2019-09-10: 4 mg via ORAL
  Filled 2019-09-10: qty 1

## 2019-09-10 NOTE — ED Provider Notes (Signed)
[facility] Emergency Department Provider Note  ____________________________________________   First MD Initiated Contact with Patient 09/10/19 2300     (approximate)  I have reviewed the triage vital signs and the nursing notes.   HISTORY  Chief Complaint Abdominal Pain    HPI Charlotte Hawkins is a 33 y.o. female with anxiety, depression, surgery on 07/28/2019 for tubo-ovarian abscess who presents with abdominal pain.  Patient was seen on 10/5 for abdominal pain.  Patient had an ultrasound done that showed a fluid collection along the right adnexa.  The ultrasound was discussed with Dr. Logan BoresEvans who performed the patient's surgery who do not believe that this was an active infection or at least one that would require any intervention.  Patient was put back on doxycycline and Percocet and was to follow-up with Dr. Logan BoresEvans.  Patient was [facility] and had an ultrasound showing similar free fluid in the pelvis and likely complex left ovarian cyst.  She had a CT scan that showed similar as well as a density on the right adnexa.  Patient was admitted for presumed PID.  Patient was discharged after 2 days.  She says she did not have any improvement with IV pain medications and IV antibiotics.  She is continue to have lower abdominal pain that is a sharp sensation, mostly suprapubic but occasionally off on the sides as well, severe, constant, not better with home oxycodone, associated with vomiting.  Just endorses some loose stools as well.  She is currently on doxycycline.          Past Medical History:  Diagnosis Date   Anxiety    Constipation    Depression    Ovarian cyst     There are no active problems to display for this patient.   Past Surgical History:  Procedure Laterality Date   LAPAROSCOPIC UNILATERAL SALPINGECTOMY Right 07/30/2019   Procedure: LAPAROSCOPIC UNILATERAL SALPINGECTOMY, DRAIN PELVIC ABSCESS;  Surgeon: Linzie CollinEvans, David James,  MD;  Location: [facility] ORS;  Service: Gynecology;  Laterality: Right;   LAPAROSCOPY Right 07/30/2019   Procedure: LAPAROSCOPY DIAGNOSTIC, RIGHT;  Surgeon: Linzie CollinEvans, David James, MD;  Location: [facility] ORS;  Service: Gynecology;  Laterality: Right;   MANDIBLE FRACTURE SURGERY      Prior to Admission medications   Medication Sig Start Date End Date Taking? Authorizing Provider  doxycycline (VIBRA-TABS) 100 MG tablet Take 1 tablet (100 mg total) by mouth 2 (two) times daily. 09/03/19   Minna AntisPaduchowski, Kevin, MD  FLUoxetine (PROZAC) 20 MG tablet Take 20 mg by mouth 2 (two) times daily. 10/19/18   [provider]  gabapentin (NEURONTIN) 300 MG capsule Take 300 mg by mouth at bedtime.  12/31/15   [provider]  oxyCODONE-acetaminophen (PERCOCET) 5-325 MG tablet Take 1 tablet by mouth every 4 (four) hours as needed for severe pain. 09/03/19   Minna AntisPaduchowski, Kevin, MD    Allergies Bee venom and Pepto-bismol [bismuth subsalicylate]  Family History  Problem Relation Age of Onset   Heart disease Mother    Breast cancer Neg Hx    Ovarian cancer Neg Hx    Colon cancer Neg Hx    Diabetes Neg Hx     Social History Social History   Tobacco Use   Smoking status: Current Every Day Smoker    Packs/day: 0.20    Types: Cigarettes   Smokeless tobacco: Never Used  Substance Use Topics   Alcohol use: Yes    Comment: rare   Drug use:  Yes    Frequency: 3.0 times per week    Types: Marijuana    Comment: hx of positive cocaine       Review of Systems Constitutional: No fever/chills Eyes: No visual changes. ENT: No sore throat. Cardiovascular: Denies chest pain. Respiratory: Denies shortness of breath. Gastrointestinal: Positive abdominal pain, diarrhea, vomiting Genitourinary: Negative for dysuria. Musculoskeletal: Negative for back pain. Skin: Negative for rash. Neurological: Negative for headaches, focal weakness or numbness. All other ROS  negative ____________________________________________   PHYSICAL EXAM:  VITAL SIGNS: ED Triage Vitals  Enc Vitals Group     BP 09/10/19 2137 (!) 141/84     Pulse Rate 09/10/19 2137 71     Resp 09/10/19 2137 20     Temp 09/10/19 2137 98.8 F (37.1 C)     Temp Source 09/10/19 2137 Oral     SpO2 09/10/19 2137 100 %     Weight 09/10/19 2122 193 lb (87.5 kg)     Height 09/10/19 2122  (1.575 m)     Head Circumference --      Peak Flow --      Pain Score 09/10/19 2122 10     Pain Loc --      Pain Edu? --      Excl. in [place]? --     Constitutional: Alert and oriented. Well appearing and in no acute distress. Eyes: Conjunctivae are normal. EOMI. Head: Atraumatic. Nose: No congestion/rhinnorhea. Mouth/Throat: Mucous membranes are moist.   Neck: No stridor. Trachea Midline. FROM Cardiovascular: Normal rate, regular rhythm. Grossly normal heart sounds.  Good peripheral circulation. Respiratory: Normal respiratory effort.  No retractions. Lungs CTAB. Gastrointestinal: Soft some slight tenderness suprapubically.  No rebound no guarding.  No distention. No abdominal bruits.  Musculoskeletal: No lower extremity tenderness nor edema.  No joint effusions. Neurologic:  Normal speech and language. No gross focal neurologic deficits are appreciated.  Skin:  Skin is warm, dry and intact. No rash noted. Psychiatric: Mood and affect are normal. Speech and behavior are normal. GU: Deferred given patient declined given recurrent recent exams.  ____________________________________________   LABS (all labs ordered are listed, but only abnormal results are displayed)  Labs Reviewed  CBC WITH DIFFERENTIAL/PLATELET - Abnormal; Notable for the following components:      Result Value   Hemoglobin 10.2 (*)    HCT 33.9 (*)    MCV 78.1 (*)    MCH 23.5 (*)    RDW 18.2 (*)    All other components within normal limits  COMPREHENSIVE METABOLIC PANEL - Abnormal; Notable for the following components:    Potassium 3.4 (*)    CO2 21 (*)    All other components within normal limits  URINALYSIS, COMPLETE (UACMP) WITH MICROSCOPIC - Abnormal; Notable for the following components:   Color, Urine YELLOW (*)    APPearance HAZY (*)    All other components within normal limits  GI PATHOGEN PANEL BY PCR, STOOL  C DIFFICILE QUICK SCREEN W PCR REFLEX  LIPASE, BLOOD  POCT PREGNANCY, URINE   ____________________________________________   ED ECG REPORT I, Concha Se, the attending physician, personally viewed and interpreted this ECG.  EKG is normal sinus rate of 72, no ST elevation, T wave inversion in aVL, normal intervals ____________________________________________  RADIOLOGY   Official radiology report(s): US Pelvic Complete W Transvaginal And Torsion R/o  Result Date: 09/11/2019 CLINICAL DATA:  Recurrent lower abdominal pain EXAM: TRANSABDOMINAL AND TRANSVAGINAL ULTRASOUND OF PELVIS DOPPLER ULTRASOUND OF OVARIES TECHNIQUE: Both  transabdominal and transvaginal ultrasound examinations of the pelvis were performed. Transabdominal technique was performed for global imaging of the pelvis including uterus, ovaries, adnexal regions, and pelvic cul-de-sac. It was necessary to proceed with endovaginal exam following the transabdominal exam to visualize the endometrium and ovaries. Color and duplex Doppler ultrasound was utilized to evaluate blood flow to the ovaries. COMPARISON:  Pelvic ultrasound 09/03/2019 CT abdomen pelvis July 22, 2019 FINDINGS: Uterus Measurements: 8.5 x 4.5 x 5.3 cm = volume: 105.6 mL. No visible fibroids or concerning uterine lesions. Few nabothian cysts seen at the level of the cervix. Endometrium Thickness: 5.6 mm, within normal limits. Appropriate positioned IUD within the endometrial canal. Right ovary Measurements: 2.9 x 2.4 x 2.0 = volume: 7 mL. Normal appearance of the right ovary. Normal follicles. Left ovary Measurements: 5.2 x 2.5 x 3.3 cm = volume: 22.7 mL. Some  increasing internal complexity with a left ovarian follicle seen previously now with reticular pattern of internal echoes suggesting a hemorrhagic cyst or involuting corpus luteum. Pulsed Doppler evaluation of both ovaries demonstrates normal low-resistance arterial and venous waveforms. Other findings Small to moderate amount of septate free fluid within the right adnexa is similar to comparison exam. No discernible tubo-ovarian abscess is seen. IMPRESSION: Septate fluid about the right ovary is similar from comparison study 8 days prior. Could reflect recurrent PID or sequela from prior infection.Right ovary itself has a normal appearance. Increasing internal complexity of a left ovarian cyst now with reticular internal echoes is most compatible with a corpus hemorrhagicum/hemorrhagic cyst. Expected position of the shadowing IUD. Electronically Signed   By: Kreg Shropshire M.D.   On: 09/11/2019 02:05    ____________________________________________   PROCEDURES  Procedure(s) performed (including Critical Care):  Procedures   ____________________________________________   INITIAL IMPRESSION / ASSESSMENT AND PLAN / ED COURSE  Tanis Hensarling was evaluated in Emergency Department on 09/10/2019 for the symptoms described in the history of present illness. She was evaluated in the context of the global COVID-19 pandemic, which necessitated consideration that the patient might be at risk for infection with the SARS-CoV-2 virus that causes COVID-19. Institutional protocols and algorithms that pertain to the evaluation of patients at risk for COVID-19 are in a state of rapid change based on information released by regulatory bodies including the CDC and federal and state organizations. These policies and algorithms were followed during the patient's care in the ED.    Patient is a 33 year old who is status post tubo-ovarian abscess surgery on the right.  She is coming in with recurrent pain.  She has already  had 2 CT scans and 2 transvaginal ultrasounds within the last 1 week as well admission with IV antibiotics without any improvement in symptoms.  Patient said that she was feeling better after the surgery but these also started 1 to 2 weeks ago.  Will get labs evaluate for dehydration, AKI, pregnancy, UTI.  Will get repeat transvaginal ultrasound to ensure that the fluid collection is not getting bigger.  Patient is not febrile and has no white count which is reassuring.  Patient does use marijuana daily so this could be hyperemesis secondary to [facility] use.  Will give dose of Haldol and reassess patient's pain and nausea.  We discussed CT imaging given prior CT scans were negative she does not want to have repeat CT imaging.  Patient declined recurrent pelvic exam given her current recent exams.  Low suspicion for SBO or other acute abdominal process given CT imaging has been negative  previously and her pain is similar to prior.  Pregnancy test is negative Urine negative for UTI White count is normal at 7.9 Hemoglobin is at baseline.  Patient is having little bit of restless legs, will give 25 mg of Benadryl.  Transvaginal ultrasound shows similar fluid on the right side with concerns for hemorrhagic cyst on the left side.  Patient feels better after the Haldol.  She is not had any vomiting since being here.  She has not been able to give a stool sample.  I discussed that I could talk to the [facility] doctors about the ultrasound results and possible admission for pain control but patient declined.  Patient said that she would prefer to leave.  Patient says she will call her OB team tomorrow.  Again offered to talk to even the hospitalist for admission but she says that she would prefer to go home to her child.  We will give a short course of Zofran and oxycodone to help with her symptoms.  She will call her OB team tomorrow.  We will give short course of Zofran and oxycodone patient can get follow-up with her OB  team.  Given patient is not wanting to stay in the hospital at this time we encouraged her to follow-up with her OB team as soon as possible.       ____________________________________________   FINAL CLINICAL IMPRESSION(S) / ED DIAGNOSES   Final diagnoses:  Abdominal pain  Hemorrhagic cyst of left ovary      MEDICATIONS GIVEN DURING THIS VISIT:  Medications  ondansetron (ZOFRAN-ODT) disintegrating tablet 4 mg (4 mg Oral Given 09/10/19 2148)  haloperidol lactate (HALDOL) injection 2 mg (2 mg Intravenous Given 09/10/19 2349)  sodium chloride 0.9 % bolus 1,000 mL (0 mLs Intravenous Stopped 09/11/19 0236)  diphenhydrAMINE (BENADRYL) injection 25 mg (25 mg Intravenous Given 09/11/19 0236)     ED Discharge Orders         Ordered    oxyCODONE (ROXICODONE) 5 MG immediate release tablet  Every 8 hours PRN     09/11/19 0238    ondansetron (ZOFRAN) 4 MG tablet  Daily PRN     09/11/19 0238           Note:  This document was prepared using Dragon voice recognition software and may include unintentional dictation errors.   Vanessa Bonita, MD 09/11/19 [phone]

## 2019-09-10 NOTE — ED Triage Notes (Signed)
Patient ambulatory to triage with steady gait, without difficulty or distress noted , mask in place; pt reports mid lower abd pain accomp by N/V; st seen for same recently with no dx by pain persists

## 2019-09-11 ENCOUNTER — Emergency Department: Payer: Medicaid Other

## 2019-09-11 MED ORDER — OXYCODONE HCL 5 MG PO TABS: 5.0000 mg | ORAL_TABLET | Freq: Three times a day (TID) | ORAL | 0 refills | Status: AC | PRN

## 2019-09-11 MED ORDER — DIPHENHYDRAMINE HCL 50 MG/ML IJ SOLN
25.0000 mg | Freq: Once | INTRAMUSCULAR | Status: AC
  Administered 2019-09-11: 25 mg via INTRAVENOUS
  Filled 2019-09-11: qty 1

## 2019-09-11 MED ORDER — ONDANSETRON HCL 4 MG PO TABS: 4.0000 mg | ORAL_TABLET | Freq: Every day | ORAL | 0 refills | Status: AC | PRN

## 2019-09-11 NOTE — Discharge Instructions (Signed)
You should continue taking your doxycycline.  You should take the nausea medicine called Zofran and the oxycodone to help with pain.  Do not drink alcohol or work or drive while taking oxycodone.  You should call your OB team tomorrow to try to get an earlier appointment to discuss. We offered to discuss with them tonight for admission for symptoms but you prefer to follow up outpatient.    Septate fluid about the right ovary is similar from comparison study 8 days prior. Could reflect recurrent PID or sequela from prior infection.Right ovary itself has a normal appearance.   Increasing internal complexity of a left ovarian cyst now with reticular internal echoes is most compatible with a corpus hemorrhagicum/hemorrhagic cyst.   Expected position of the shadowing IUD.

## 2019-09-18 ENCOUNTER — Encounter: Payer: Self-pay | Admitting: Obstetrics and Gynecology

## 2019-09-18 ENCOUNTER — Ambulatory Visit (INDEPENDENT_AMBULATORY_CARE_PROVIDER_SITE_OTHER): Payer: Medicaid Other | Admitting: Obstetrics and Gynecology

## 2019-09-18 ENCOUNTER — Other Ambulatory Visit: Payer: Self-pay

## 2019-09-18 VITALS — BP 147/85 | HR 80 | Ht 62.5 in | Wt 196.5 lb

## 2019-09-18 DIAGNOSIS — L292 Pruritus vulvae: Secondary | ICD-10-CM

## 2019-09-18 DIAGNOSIS — B373 Candidiasis of vulva and vagina: Secondary | ICD-10-CM | POA: Diagnosis not present

## 2019-09-18 DIAGNOSIS — B3731 Acute candidiasis of vulva and vagina: Secondary | ICD-10-CM

## 2019-09-18 MED ORDER — FLUCONAZOLE 150 MG PO TABS: 150.0000 mg | ORAL_TABLET | ORAL | 0 refills | Status: AC

## 2019-09-18 NOTE — Progress Notes (Signed)
Patient comes in today for ED follow up. Patient states that she is in a lot of pain.

## 2019-09-19 NOTE — Progress Notes (Signed)
HPI:      [name] is a 33 y.o. [phone] who LMP was Patient's last menstrual period was 08/27/2019.  Subjective:   She presents today for follow-up of laparoscopic surgery and pelvic abscess.  She states that her pain has been improving constantly since her readmission to [facility] for IV antibiotics.  She continues to take doxycycline and gabapentin. She does complain of a heavy white vaginal discharge causing significant vulvar itching. She has reassured me that her sexual partner has been treated appropriately.    Hx: The following portions of the patient's history were reviewed and updated as appropriate:             She  has a past medical history of Anxiety, Constipation, Depression, and Ovarian cyst. She does not have a problem list on file. She  has a past surgical history that includes Mandible fracture surgery; laparoscopy (Right, 07/30/2019); and Laparoscopic unilateral salpingectomy (Right, 07/30/2019). Her family history includes Heart disease in her mother. She  reports that she has been smoking cigarettes. She has been smoking about 0.20 packs per day. She has never used smokeless tobacco. She reports current alcohol use. She reports current drug use. Frequency: 3.00 times per week. Drug: Marijuana. She has a current medication list which includes the following prescription(s): doxycycline, fluoxetine, gabapentin, ondansetron, oxycodone-acetaminophen, and fluconazole. She is allergic to bee venom and pepto-bismol [bismuth subsalicylate].       Review of Systems:  Review of Systems  Constitutional: Denied constitutional symptoms, night sweats, recent illness, fatigue, fever, insomnia and weight loss.  Eyes: Denied eye symptoms, eye pain, photophobia, vision change and visual disturbance.  Ears/Nose/Throat/Neck: Denied ear, nose, throat or neck symptoms, hearing loss, nasal discharge, sinus congestion and sore throat.  Cardiovascular: Denied cardiovascular symptoms,  arrhythmia, chest pain/pressure, edema, exercise intolerance, orthopnea and palpitations.  Respiratory: Denied pulmonary symptoms, asthma, pleuritic pain, productive sputum, cough, dyspnea and wheezing.  Gastrointestinal: Denied, gastro-esophageal reflux, melena, nausea and vomiting.  Genitourinary: Denied genitourinary symptoms including symptomatic vaginal discharge, pelvic relaxation issues, and urinary complaints.  Musculoskeletal: Denied musculoskeletal symptoms, stiffness, swelling, muscle weakness and myalgia.  Dermatologic: Denied dermatology symptoms, rash and scar.  Neurologic: Denied neurology symptoms, dizziness, headache, neck pain and syncope.  Psychiatric: Denied psychiatric symptoms, anxiety and depression.  Endocrine: Denied endocrine symptoms including hot flashes and night sweats.   Meds:   Current Outpatient Medications on File Prior to Visit  Medication Sig Dispense Refill  . doxycycline (VIBRA-TABS) 100 MG tablet Take 1 tablet (100 mg total) by mouth 2 (two) times daily. 20 tablet 0  . FLUoxetine (PROZAC) 20 MG tablet Take 20 mg by mouth 2 (two) times daily.    Marland Kitchen gabapentin (NEURONTIN) 300 MG capsule Take 300 mg by mouth at bedtime.     . ondansetron (ZOFRAN) 4 MG tablet Take 1 tablet (4 mg total) by mouth daily as needed for nausea or vomiting. 15 tablet 0  . oxyCODONE-acetaminophen (PERCOCET) 5-325 MG tablet Take 1 tablet by mouth every 4 (four) hours as needed for severe pain. 20 tablet 0   No current facility-administered medications on file prior to visit.     Objective:     Vitals:   09/18/19 1606  BP: (!) 147/85  Pulse: 80                Assessment:    [id number] There are no active problems to display for this patient.    1. Monilial vulvovaginitis   2. Vulvar itching  Patient doing well from previous pelvic abscess/TOA.  Currently continued on antibiotics.   Plan:            1.  Patient to finish course of antibiotics.  2.  Diflucan for  monilia vulvitis. Orders No orders of the defined types were placed in this encounter.    Meds ordered this encounter  Medications  . fluconazole (DIFLUCAN) 150 MG tablet    Sig: Take 1 tablet (150 mg total) by mouth every 3 (three) days.    Dispense:  2 tablet    Refill:  0      F/U  Return for Pt to contact us if symptoms worsen.  Elonda Husky, M.D. 09/19/2019 4:55 PM

## 2019-09-21 ENCOUNTER — Encounter: Payer: Medicaid Other | Admitting: Obstetrics and Gynecology

## 2019-12-11 ENCOUNTER — Encounter: Payer: Self-pay | Admitting: Emergency Medicine

## 2019-12-11 ENCOUNTER — Other Ambulatory Visit: Payer: Self-pay

## 2019-12-11 ENCOUNTER — Emergency Department: Payer: Medicaid Other

## 2019-12-11 ENCOUNTER — Emergency Department
Admission: EM | Admit: 2019-12-11 | Discharge: 2019-12-11 | Disposition: A | Discharge status: Home / Self Care | Payer: Medicaid Other | Attending: Emergency Medicine | Admitting: Emergency Medicine

## 2019-12-11 DIAGNOSIS — F1210 Cannabis abuse, uncomplicated: Secondary | ICD-10-CM | POA: Diagnosis not present

## 2019-12-11 DIAGNOSIS — R112 Nausea with vomiting, unspecified: Secondary | ICD-10-CM | POA: Diagnosis not present

## 2019-12-11 DIAGNOSIS — N730 Acute parametritis and pelvic cellulitis: Secondary | ICD-10-CM | POA: Diagnosis not present

## 2019-12-11 DIAGNOSIS — R1031 Right lower quadrant pain: Secondary | ICD-10-CM | POA: Diagnosis present

## 2019-12-11 DIAGNOSIS — N898 Other specified noninflammatory disorders of vagina: Secondary | ICD-10-CM | POA: Diagnosis not present

## 2019-12-11 DIAGNOSIS — N739 Female pelvic inflammatory disease, unspecified: Secondary | ICD-10-CM

## 2019-12-11 DIAGNOSIS — F17210 Nicotine dependence, cigarettes, uncomplicated: Secondary | ICD-10-CM | POA: Insufficient documentation

## 2019-12-11 DIAGNOSIS — R102 Pelvic and perineal pain: Secondary | ICD-10-CM

## 2019-12-11 LAB — URINALYSIS, COMPLETE (UACMP) WITH MICROSCOPIC
Bacteria, UA: NONE SEEN
Bilirubin Urine: NEGATIVE
Glucose, UA: NEGATIVE mg/dL
Hgb urine dipstick: NEGATIVE
Ketones, ur: NEGATIVE mg/dL
Leukocytes,Ua: NEGATIVE
Nitrite: NEGATIVE
Protein, ur: NEGATIVE mg/dL
Specific Gravity, Urine: 1.025 (ref 1.005–1.030)
pH: 6 (ref 5.0–8.0)

## 2019-12-11 LAB — PREGNANCY, URINE: Preg Test, Ur: NEGATIVE

## 2019-12-11 LAB — CBC
HCT: 33 % — ABNORMAL LOW (ref 36.0–46.0)
Hemoglobin: 9.8 g/dL — ABNORMAL LOW (ref 12.0–15.0)
MCH: 22.6 pg — ABNORMAL LOW (ref 26.0–34.0)
MCHC: 29.7 g/dL — ABNORMAL LOW (ref 30.0–36.0)
MCV: 76 fL — ABNORMAL LOW (ref 80.0–100.0)
Platelets: 354 10*3/uL (ref 150–400)
RBC: 4.34 MIL/uL (ref 3.87–5.11)
RDW: 18.5 % — ABNORMAL HIGH (ref 11.5–15.5)
WBC: 5.7 10*3/uL (ref 4.0–10.5)
nRBC: 0 % (ref 0.0–0.2)

## 2019-12-11 LAB — COMPREHENSIVE METABOLIC PANEL
ALT: 16 U/L (ref 0–44)
AST: 22 U/L (ref 15–41)
Albumin: 4 g/dL (ref 3.5–5.0)
Alkaline Phosphatase: 63 U/L (ref 38–126)
Anion gap: 8 (ref 5–15)
BUN: 10 mg/dL (ref 6–20)
CO2: 20 mmol/L — ABNORMAL LOW (ref 22–32)
Calcium: 9 mg/dL (ref 8.9–10.3)
Chloride: 109 mmol/L (ref 98–111)
Creatinine, Ser: 0.82 mg/dL (ref 0.44–1.00)
GFR calc Af Amer: >60 mL/min (ref >60)
GFR calc non Af Amer: >60 mL/min (ref >60)
Glucose, Bld: 125 mg/dL — ABNORMAL HIGH (ref 70–99)
Potassium: 3.7 mmol/L (ref 3.5–5.1)
Sodium: 137 mmol/L (ref 135–145)
Total Bilirubin: 0.4 mg/dL (ref 0.3–1.2)
Total Protein: 8 g/dL (ref 6.5–8.1)

## 2019-12-11 LAB — WET PREP, GENITAL
Sperm: NONE SEEN
Trich, Wet Prep: NONE SEEN
Yeast Wet Prep HPF POC: NONE SEEN

## 2019-12-11 LAB — LIPASE, BLOOD: Lipase: 24 U/L (ref 11–51)

## 2019-12-11 MED ORDER — OXYCODONE-ACETAMINOPHEN 5-325 MG PO TABS
2.0000 | ORAL_TABLET | Freq: Once | ORAL | Status: AC
  Administered 2019-12-11: 2 via ORAL
  Filled 2019-12-11: qty 2

## 2019-12-11 MED ORDER — AZITHROMYCIN 500 MG PO TABS
1000.0000 mg | ORAL_TABLET | Freq: Once | ORAL | Status: AC
  Administered 2019-12-11: 12:00:00 1000 mg via ORAL
  Filled 2019-12-11: qty 2

## 2019-12-11 MED ORDER — METRONIDAZOLE 500 MG PO TABS: 500.0000 mg | ORAL_TABLET | Freq: Three times a day (TID) | ORAL | 0 refills | Status: AC

## 2019-12-11 MED ORDER — LIDOCAINE HCL (PF) 1 % IJ SOLN
INTRAMUSCULAR | Status: AC
  Administered 2019-12-11: 1 mL
  Filled 2019-12-11: qty 5

## 2019-12-11 MED ORDER — DOXYCYCLINE HYCLATE 100 MG PO CAPS: 100.0000 mg | ORAL_CAPSULE | Freq: Two times a day (BID) | ORAL | 0 refills | Status: AC

## 2019-12-11 MED ORDER — OXYCODONE-ACETAMINOPHEN 5-325 MG PO TABS: 1.0000 | ORAL_TABLET | Freq: Three times a day (TID) | ORAL | 0 refills | Status: DC | PRN

## 2019-12-11 MED ORDER — OXYCODONE-ACETAMINOPHEN 5-325 MG PO TABS: 1.0000 | ORAL_TABLET | Freq: Three times a day (TID) | ORAL | 0 refills | Status: AC | PRN

## 2019-12-11 MED ORDER — ONDANSETRON 4 MG PO TBDP
4.0000 mg | ORAL_TABLET | Freq: Once | ORAL | Status: AC
  Administered 2019-12-11: 4 mg via ORAL
  Filled 2019-12-11: qty 1

## 2019-12-11 MED ORDER — CEFTRIAXONE SODIUM 250 MG IJ SOLR
250.0000 mg | Freq: Once | INTRAMUSCULAR | Status: AC
  Administered 2019-12-11: 12:00:00 250 mg via INTRAMUSCULAR
  Filled 2019-12-11: qty 250

## 2019-12-11 NOTE — ED Triage Notes (Signed)
Pt reports pain to her right flank and abd since January 1st. Pt reports pain is sharp and shooting and goes into her epigastric area. Pt also reports NV as well.

## 2019-12-11 NOTE — ED Provider Notes (Signed)
[facility] Emergency Department Provider Note       Time seen: ----------------------------------------- 11:42 AM on 12/11/2019 -----------------------------------------   I have reviewed the triage vital signs and the nursing notes.  HISTORY  Chief Complaint Abdominal Pain, Nausea, and Emesis   HPI Charlotte Hawkins is a 34 y.o. female with a history of anxiety, constipation, depression, ovarian cyst who presents to the ED for right flank pain and right lower quadrant pain since January 1.  She is also having vaginal discharge and is concerned about infection.  Pain is sharp and shooting and goes into her right upper quadrant.  She has had some nausea and vomiting as well.  Past Medical History:  Diagnosis Date  . Anxiety   . Constipation   . Depression   . Ovarian cyst     There are no problems to display for this patient.   Past Surgical History:  Procedure Laterality Date  . LAPAROSCOPIC UNILATERAL SALPINGECTOMY Right 07/30/2019   Procedure: LAPAROSCOPIC UNILATERAL SALPINGECTOMY, DRAIN PELVIC ABSCESS;  Surgeon: Harlin Heys, MD;  Location: [facility] ORS;  Service: Gynecology;  Laterality: Right;  . LAPAROSCOPY Right 07/30/2019   Procedure: LAPAROSCOPY DIAGNOSTIC, RIGHT;  Surgeon: Harlin Heys, MD;  Location: [facility] ORS;  Service: Gynecology;  Laterality: Right;  . MANDIBLE FRACTURE SURGERY      Allergies Bee venom and Pepto-bismol [bismuth subsalicylate]  Social History Social History   Tobacco Use  . Smoking status: Current Every Day Smoker    Packs/day: 0.20    Types: Cigarettes  . Smokeless tobacco: Never Used  Substance Use Topics  . Alcohol use: Yes    Comment: rare  . Drug use: Yes    Frequency: 3.0 times per week    Types: Marijuana    Comment: hx of positive cocaine     Review of Systems Constitutional: Negative for fever. Cardiovascular: Negative for chest pain. Respiratory: Negative for shortness of  breath. Gastrointestinal: Positive for abdominal pain, nausea vomiting Genitourinary: Positive for discharge Musculoskeletal: Negative for back pain. Skin: Negative for rash. Neurological: Negative for headaches, focal weakness or numbness.  All systems negative/normal/unremarkable except as stated in the HPI  ____________________________________________   PHYSICAL EXAM:  VITAL SIGNS: ED Triage Vitals  Enc Vitals Group     BP 12/11/19 0829 137/64     Pulse Rate 12/11/19 0829 83     Resp 12/11/19 0829 18     Temp 12/11/19 0829 98.8 F (37.1 C)     Temp Source 12/11/19 0829 Oral     SpO2 12/11/19 0829 100 %     Weight 12/11/19 0827 196 lb (88.9 kg)     Height 12/11/19 0827 5\' 2"  (1.575 m)     Head Circumference --      Peak Flow --      Pain Score 12/11/19 0826 10     Pain Loc --      Pain Edu? --      Excl. in [place]? --    Constitutional: Alert and oriented. Well appearing and in no distress. Eyes: Conjunctivae are normal. Normal extraocular movements. ENT      Head: Normocephalic and atraumatic.      Nose: No congestion/rhinnorhea.      Mouth/Throat: Mucous membranes are moist.      Neck: No stridor. Cardiovascular: Normal rate, regular rhythm. No murmurs, rubs, or gallops. Respiratory: Normal respiratory effort without tachypnea nor retractions. Breath sounds are clear and equal bilaterally. No wheezes/rales/rhonchi. Gastrointestinal: Right lower quadrant tenderness,  right flank tenderness, right hemipelvic tenderness, normal bowel sounds Genitourinary: Moderate vaginal discharge, exquisite cervical motion tenderness, right adnexal tenderness Musculoskeletal: Nontender with normal range of motion in extremities. No lower extremity tenderness nor edema. Neurologic:  Normal speech and language. No gross focal neurologic deficits are appreciated.  Skin:  Skin is warm, dry and intact. No rash noted. Psychiatric: Mood and affect are normal. Speech and behavior are normal.   ____________________________________________  ED COURSE:  As part of my medical decision making, I reviewed the following data within the electronic MEDICAL RECORD NUMBER History obtained from family if available, nursing notes, old chart and ekg, as well as notes from prior ED visits. Patient presented for pelvic pain, we will assess with labs and imaging as indicated at this time.   Procedures  Charlotte Hawkins was evaluated in Emergency Department on 12/11/2019 for the symptoms described in the history of present illness. She was evaluated in the context of the global COVID-19 pandemic, which necessitated consideration that the patient might be at risk for infection with the SARS-CoV-2 virus that causes COVID-19. Institutional protocols and algorithms that pertain to the evaluation of patients at risk for COVID-19 are in a state of rapid change based on information released by regulatory bodies including the CDC and federal and state organizations. These policies and algorithms were followed during the patient's care in the ED.  ____________________________________________   LABS (pertinent positives/negatives)  Labs Reviewed  WET PREP, GENITAL - Abnormal; Notable for the following components:      Result Value   Clue Cells Wet Prep HPF POC PRESENT (*)    WBC, Wet Prep HPF POC MANY (*)    All other components within normal limits  COMPREHENSIVE METABOLIC PANEL - Abnormal; Notable for the following components:   CO2 20 (*)    Glucose, Bld 125 (*)    All other components within normal limits  CBC - Abnormal; Notable for the following components:   Hemoglobin 9.8 (*)    HCT 33.0 (*)    MCV 76.0 (*)    MCH 22.6 (*)    MCHC 29.7 (*)    RDW 18.5 (*)    All other components within normal limits  URINALYSIS, COMPLETE (UACMP) WITH MICROSCOPIC - Abnormal; Notable for the following components:   Color, Urine YELLOW (*)    APPearance CLEAR (*)    All other components within normal limits   CHLAMYDIA/NGC RT PCR ([facility] ONLY)  LIPASE, BLOOD    RADIOLOGY Images were viewed by me  Pelvic ultrasound IMPRESSION:  IUD within expected position at the upper uterine segment.   Otherwise unremarkable endometrial complex and LEFT ovary.   Complex cystic lesion of the RIGHT ovary 3.1 cm greatest size favor  hemorrhagic cyst; Short-interval follow up ultrasound in 6-12 weeks  is recommended, preferably during the week following the patient's  normal menses.   Moderate amount of free pelvic fluid of uncertain etiology, could be  related to hemorrhagic cyst rupture.   No definite evidence of ovarian torsion.  ____________________________________________   DIFFERENTIAL DIAGNOSIS   Ovarian cyst, PID, cervicitis, torsion, appendicitis, cholecystitis  FINAL ASSESSMENT AND PLAN  PID   Plan: The patient had presented for right lower quadrant abdominal pain. Patient's labs were at her baseline. Patient's imaging revealed some free fluid in the pelvis.  She does have a known complex cystic lesion of the right ovary.  Clinically the patient has pelvic inflammatory disease, was given Rocephin and Zithromax.  She be discharged home with  doxycycline and Flagyl as well as pain medicine.  She will be referred to GYN for outpatient follow-up.   Ulice Dash, MD    Note: This note was generated in part or whole with voice recognition software. Voice recognition is usually quite accurate but there are transcription errors that can and very often do occur. I apologize for any typographical errors that were not detected and corrected.     Emily Filbert, MD 12/11/19 1459

## 2019-12-14 LAB — GC/CHLAMYDIA PROBE AMP
Chlamydia trachomatis, NAA: POSITIVE — AB
Neisseria Gonorrhoeae by PCR: NEGATIVE

## 2019-12-17 ENCOUNTER — Telehealth: Payer: Self-pay | Admitting: Emergency Medicine

## 2019-12-17 NOTE — Telephone Encounter (Addendum)
Called patient to inform of positive chlamydia test.  She was treated during visit.  Left message  Patient called me back.  I gave her results.  She is also taking her prescriptions.

## 2021-05-16 IMAGING — CT CT ABDOMEN AND PELVIS WITH CONTRAST
2 of 4 series · 16 of 46 positions shown, 18 images · IV contrast (APPLIED)
Comparison: Pelvic ultrasound, 07/14/2019

CLINICAL DATA: Right lower quadrant abdominal pain, appendicitis
suspected, recent IUD placement

EXAM:
CT ABDOMEN AND PELVIS WITH CONTRAST
TECHNIQUE: Multidetector CT imaging of the abdomen and pelvis was performed
using the standard protocol following bolus administration of
intravenous contrast.
CONTRAST:  100mL OMNIPAQUE IOHEXOL 300 MG/ML SOLN, additional oral
enteric contrast

[Series 2: routine abd/pel with · axial · 0.75mm/px · z∈[-487,-107]mm · 13 of 84 slices shown, 15 images]
[im 4/84  soft-tissue]
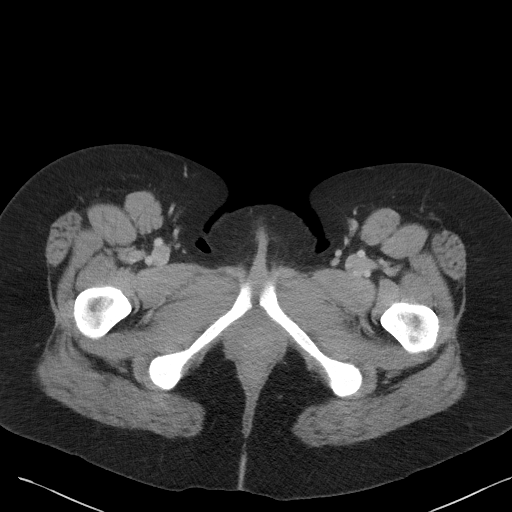
[im 4/84  bone]
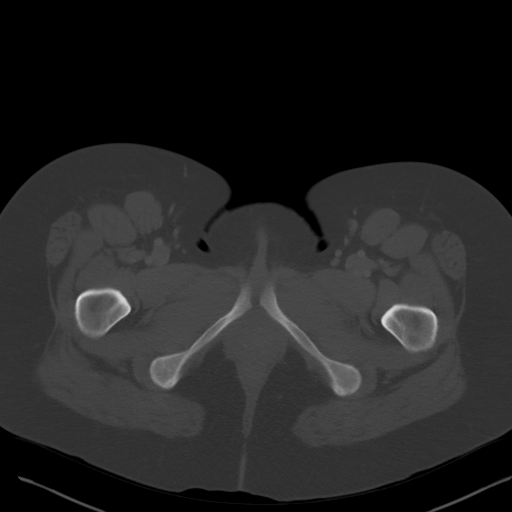
[im 11/84  soft-tissue]
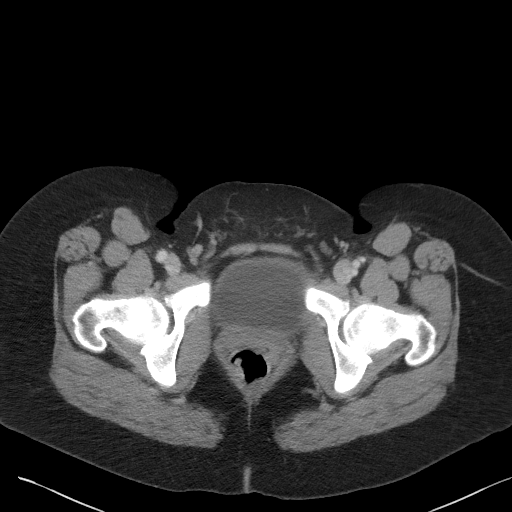
[im 18/84  soft-tissue]
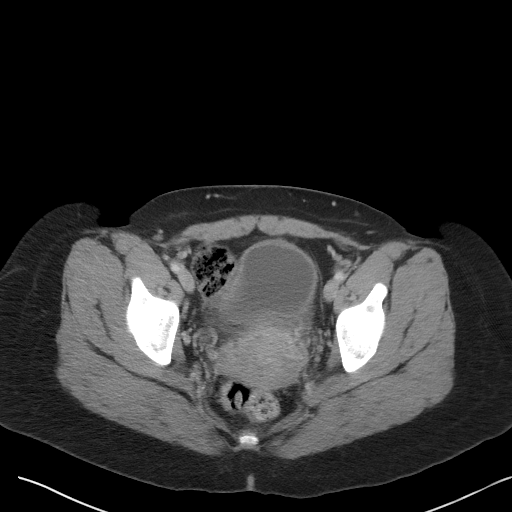
[im 25/84  soft-tissue]
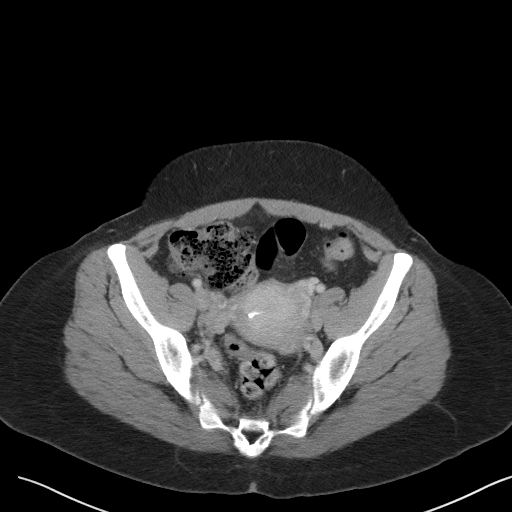
[im 28/84  soft-tissue]
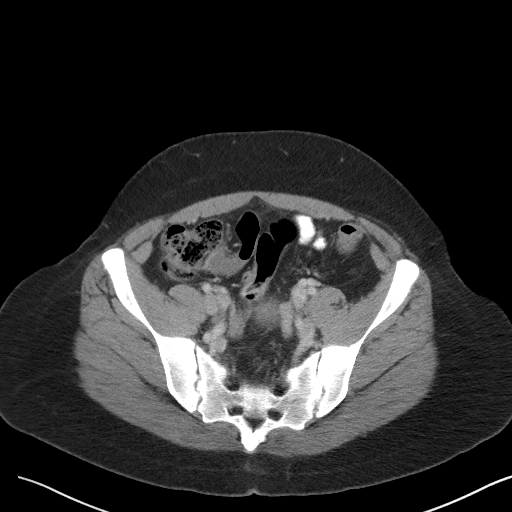
[im 35/84  soft-tissue]
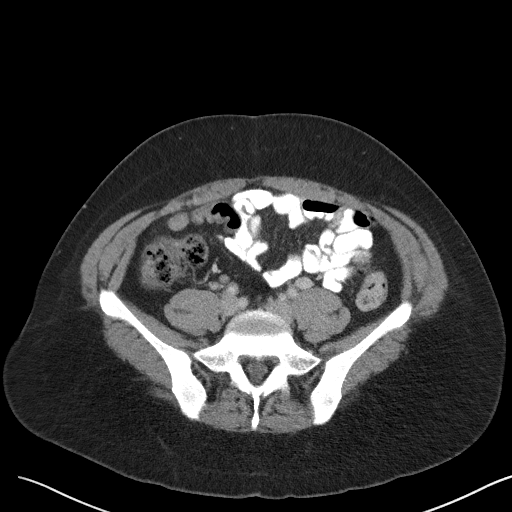
[im 42/84  soft-tissue]
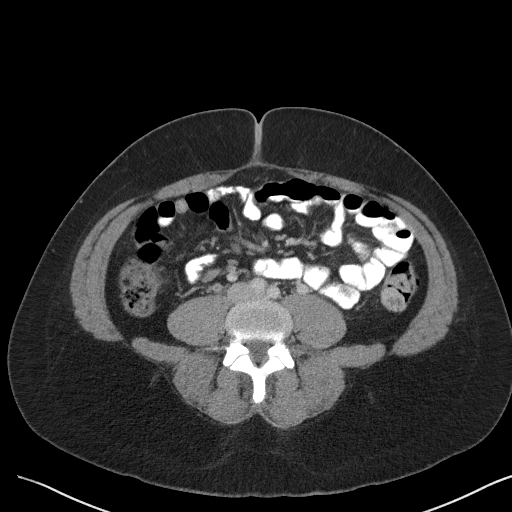
[im 49/84  soft-tissue]
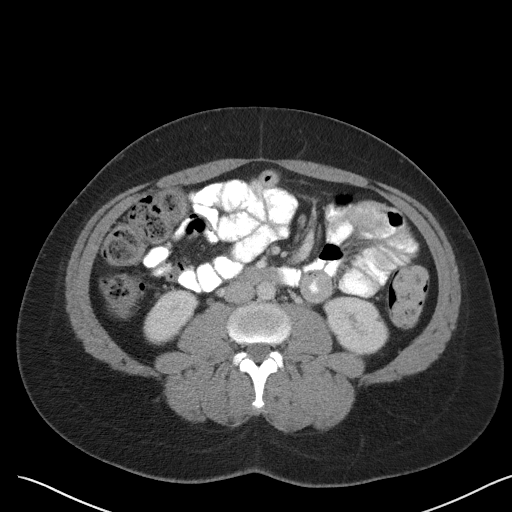
[im 56/84  soft-tissue]
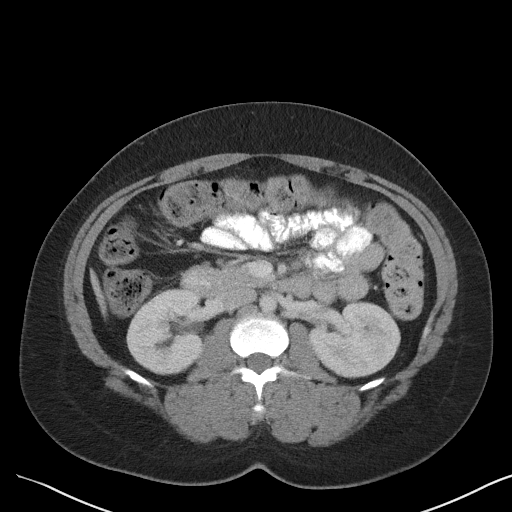
[im 56/84  bone]
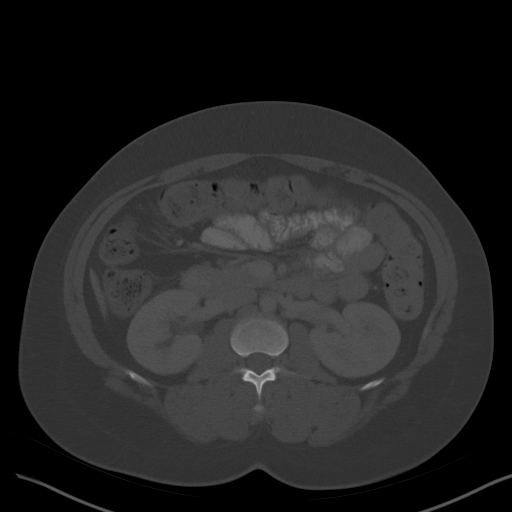
[im 59/84  soft-tissue]
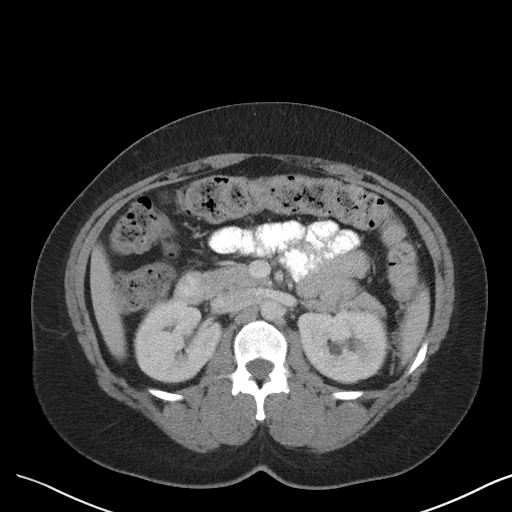
[im 66/84  soft-tissue]
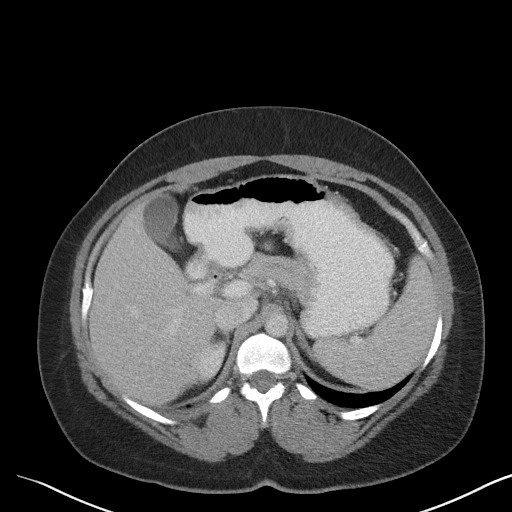
[im 73/84  soft-tissue]
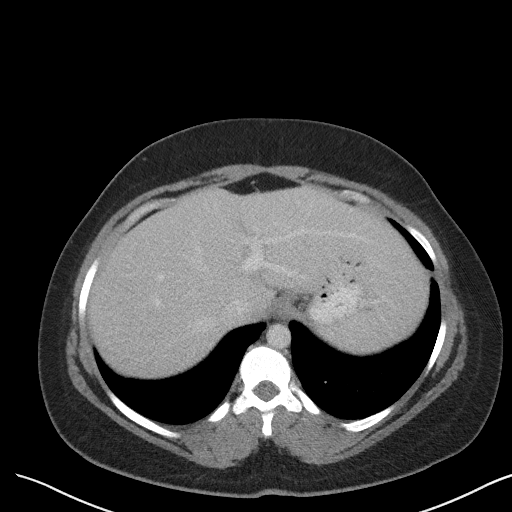
[im 80/84  soft-tissue]
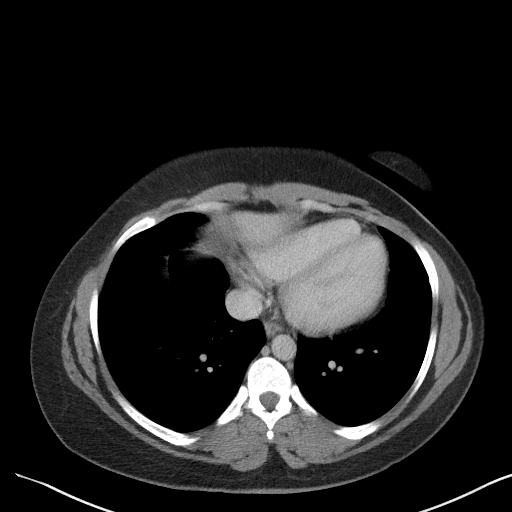

[Series 5: coronal st · coronal · 0.69mm/px · 3 of 94 slices shown]
[im 32/94  soft-tissue]
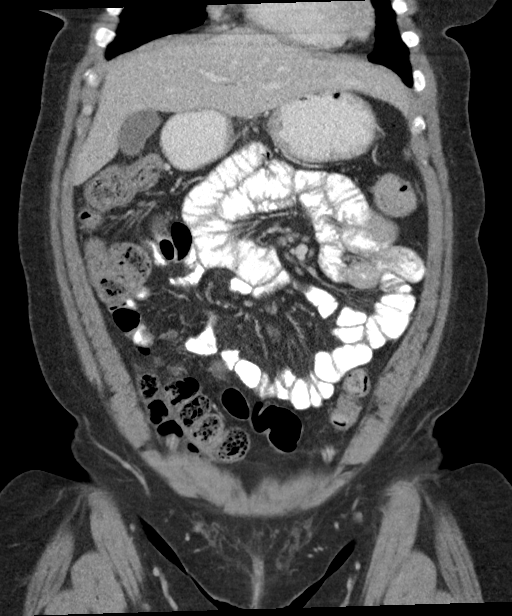
[im 42/94  soft-tissue]
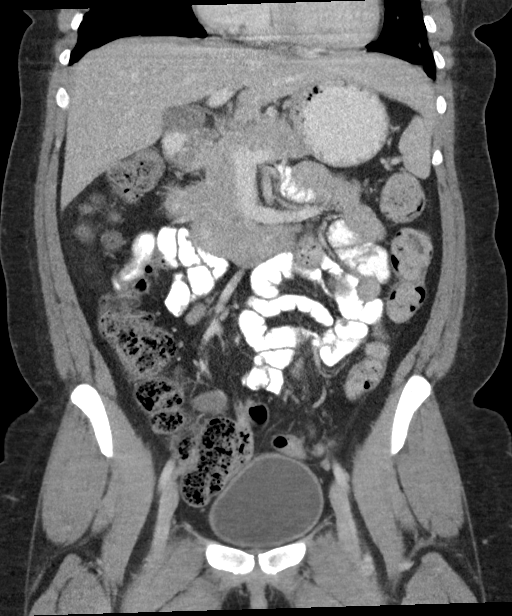
[im 52/94  soft-tissue]
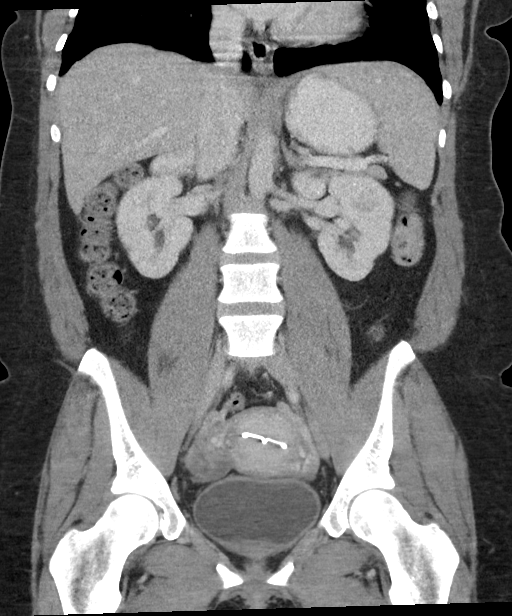

[16 of 46 positions shown; findings below may reference images not displayed]

FINDINGS: Lower chest: No acute abnormality.

Hepatobiliary: No solid liver abnormality is seen. No gallstones,
gallbladder wall thickening, or biliary dilatation.

Pancreas: Unremarkable. No pancreatic ductal dilatation or
surrounding inflammatory changes.

Spleen: Normal in size without significant abnormality.

Adrenals/Urinary Tract: Adrenal glands are unremarkable. Kidneys are
normal, without renal calculi, solid lesion, or hydronephrosis.
Bladder is unremarkable.

Stomach/Bowel: Stomach is within normal limits. Appendix appears
normal. No evidence of bowel wall thickening, distention, or
inflammatory changes. Large burden of stool in the colon.

Vascular/Lymphatic: No significant vascular findings are present. No
enlarged abdominal or pelvic lymph nodes.

Reproductive: The right ovary measures approximately 4.3 x 2.1 x
cm and contains multiple heterogeneous internal cysts or follicles.
IUD is present in the endometrial cavity.

Other: No abdominal wall hernia or abnormality. No abdominopelvic
ascites.

Musculoskeletal: No acute or significant osseous findings.
IMPRESSION: 1. No definite CT findings of the abdomen or pelvis to explain right
lower quadrant abdominal pain. Normal appendix.

2. The right ovary measures approximately 4.3 x 2.1 x 2.9 cm and
contains multiple heterogeneous internal cysts or follicles, in
keeping with appearance on ultrasound dated 07/14/2019. IUD is
present in the endometrial cavity.

3.  Large burden of stool in the colon.

## 2021-12-15 ENCOUNTER — Emergency Department: Payer: Medicaid Other

## 2021-12-15 ENCOUNTER — Other Ambulatory Visit: Payer: Self-pay

## 2021-12-15 ENCOUNTER — Emergency Department
Admission: EM | Admit: 2021-12-15 | Discharge: 2021-12-15 | Disposition: A | Discharge status: Home / Self Care | Payer: Medicaid Other | Attending: Student in an Organized Health Care Education/Training Program | Admitting: Student in an Organized Health Care Education/Training Program

## 2021-12-15 DIAGNOSIS — R079 Chest pain, unspecified: Secondary | ICD-10-CM | POA: Insufficient documentation

## 2021-12-15 LAB — CBC
HCT: 40.4 % (ref 36.0–46.0)
Hemoglobin: 13.5 g/dL (ref 12.0–15.0)
MCH: 30.2 pg (ref 26.0–34.0)
MCHC: 33.4 g/dL (ref 30.0–36.0)
MCV: 90.4 fL (ref 80.0–100.0)
Platelets: 271 10*3/uL (ref 150–400)
RBC: 4.47 MIL/uL (ref 3.87–5.11)
RDW: 13.7 % (ref 11.5–15.5)
WBC: 8.1 10*3/uL (ref 4.0–10.5)
nRBC: 0 % (ref 0.0–0.2)

## 2021-12-15 LAB — BASIC METABOLIC PANEL
Anion gap: 10 (ref 5–15)
BUN: 9 mg/dL (ref 6–20)
CO2: 24 mmol/L (ref 22–32)
Calcium: 9.5 mg/dL (ref 8.9–10.3)
Chloride: 104 mmol/L (ref 98–111)
Creatinine, Ser: 0.71 mg/dL (ref 0.44–1.00)
GFR, Estimated: >60 mL/min (ref >60)
Glucose, Bld: 111 mg/dL — ABNORMAL HIGH (ref 70–99)
Potassium: 3.3 mmol/L — ABNORMAL LOW (ref 3.5–5.1)
Sodium: 138 mmol/L (ref 135–145)

## 2021-12-15 LAB — TROPONIN I (HIGH SENSITIVITY)
Troponin I (High Sensitivity): 2 ng/L (ref <18)
Troponin I (High Sensitivity): 3 ng/L (ref <18)

## 2021-12-15 NOTE — ED Triage Notes (Signed)
Pt here with CP that started a few weeks ago. Pt states that pain is centered but does not radiate. Pt denies N/V/D. Pt in NAD in triage.

## 2021-12-15 NOTE — ED Provider Notes (Signed)
[facility] Provider Note  Patient Contact: 6:12 PM (approximate)   History   Chest Pain   HPI  Charlotte Hawkins is a 36 y.o. female who presents the emergency department complaining of chest pain for roughly a month.  Patient had flulike illness, was seen at urgent care and tested positive for flu.  Afterwards patient has had some central chest pain that she describes as constant.  No shortness of breath.  No personal cardiac history but she has had 2 family members with her mother and grandmother who had heart attacks before the age of 29.  Patient was concerned and presents to the ED for evaluation.  No radiation of pain.  No shortness of breath.  Patient does not take any medications for same.  She states that the pain is improving from when it first started roughly a month ago to today.  No GI complaints.  No ongoing URI symptoms.     Physical Exam   Triage Vital Signs: ED Triage Vitals  Enc Vitals Group     BP 12/15/21 1553 132/76     Pulse Rate 12/15/21 1553 77     Resp 12/15/21 1553 18     Temp 12/15/21 1553 98.5 F (36.9 C)     Temp Source 12/15/21 1553 Oral     SpO2 12/15/21 1553 96 %     Weight 12/15/21 1551 192 lb (87.1 kg)     Height 12/15/21 1551 5\' 2"  (1.575 m)     Head Circumference --      Peak Flow --      Pain Score 12/15/21 1551 5     Pain Loc --      Pain Edu? --      Excl. in [place]? --     Most recent vital signs: Vitals:   12/15/21 1553  BP: 132/76  Pulse: 77  Resp: 18  Temp: 98.5 F (36.9 C)  SpO2: 96%     General: Alert and in no acute distress. ENT:      Ears:       Nose: No congestion/rhinnorhea.      Mouth/Throat: Mucous membranes are moist Cardiovascular:  Good peripheral perfusion.  No murmurs, rubs, gallops Respiratory: Normal respiratory effort without tachypnea or retractions. Lungs CTAB. Good air entry to the bases with no decreased or absent breath sounds Musculoskeletal: Full range of motion to all  extremities.  Neurologic:  No gross focal neurologic deficits are appreciated.  Skin:   No rash noted Other:   ED Results / Procedures / Treatments   Labs (all labs ordered are listed, but only abnormal results are displayed) Labs Reviewed  BASIC METABOLIC PANEL - Abnormal; Notable for the following components:      Result Value   Potassium 3.3 (*)    Glucose, Bld 111 (*)    All other components within normal limits  CBC  POC URINE PREG, ED  TROPONIN I (HIGH SENSITIVITY)  TROPONIN I (HIGH SENSITIVITY)     EKG  ED ECG REPORT I, 12/17/21 Brenly Trawick,  personally viewed and interpreted this ECG.   Date: 12/15/2021  EKG Time: 1523 hrs.  Rate: 77 bpm  Rhythm: unchanged from previous tracings, normal sinus rhythm  Axis: Rightward axis  Intervals:none  ST&T Change: No ST elevation or depression noted  Normal sinus rhythm.  No STEMI.  Largely unchanged from previous EKG from October 2020    RADIOLOGY  I personally viewed and evaluated these images as part  of my medical decision making, as well as reviewing the written report by the radiologist.  ED Provider Interpretation: No acute cardiopulmonary findings on chest x-ray  DG Chest 2 View  Result Date: 12/15/2021 CLINICAL DATA:  Chest pain EXAM: CHEST - 2 VIEW COMPARISON:  02/13/2019 FINDINGS: The heart size and mediastinal contours are within normal limits. Both lungs are clear. The visualized skeletal structures are unremarkable. IMPRESSION: No active cardiopulmonary disease. Electronically Signed   By: Marlan Palau M.D.   On: 12/15/2021 16:13    PROCEDURES:  Critical Care performed: No  Procedures   MEDICATIONS ORDERED IN ED: Medications - No data to display   IMPRESSION / MDM / ASSESSMENT AND PLAN / ED COURSE  I reviewed the triage vital signs and the nursing notes.                              Differential diagnosis includes, but is not limited to, STEMI, NSTEMI, costochondritis, nonspecific chest pain,  pneumonia, bronchitis, PE   Patient's diagnosis is consistent with nonspecific chest pain.  Patient presented to the emergency department complaining of chest pain that been ongoing times a month.  This started after patient had the flu.  Symptoms have been improving but is a not fully resolved patient wanted to be evaluated.  She has a strong cardiac history in regards to family though not personally.  Patient's mother and grandmother both had heart attacks before the age of 49.  Overall exam was reassuring.  Given the history, concern for cardiac involvement though the month-long symptoms are less likely an acute STEMI.  EKG is reassuring.  Troponins are within the normal limit.  Potassium was slightly below normal limits but no significant findings on CBC.  Patient with ongoing reassuring exam.  At this time patient is stable for discharge.  Follow-up with cardiology given the strong familial history of heart problems.  Return precautions discussed with the patient..  Patient is given ED precautions to return to the ED for any worsening or new symptoms.    Clinical Course as of 12/15/21 1948  Tue Dec 15, 2021  1651 Troponin I (High Sensitivity) [AE]  1651 Troponin I (High Sensitivity): 2 [AE]    Clinical Course User Index [AE] Gladys Damme, Student-PA     FINAL CLINICAL IMPRESSION(S) / ED DIAGNOSES   Final diagnoses:  Nonspecific chest pain     Rx / DC Orders   ED Discharge Orders     None        Note:  This document was prepared using Dragon voice recognition software and may include unintentional dictation errors.   Lanette Hampshire 12/15/21 1948    Willy Eddy, MD 12/15/21 2351

## 2022-03-08 ENCOUNTER — Emergency Department: Payer: Medicaid Other

## 2022-03-08 DIAGNOSIS — Z20822 Contact with and (suspected) exposure to covid-19: Secondary | ICD-10-CM | POA: Diagnosis not present

## 2022-03-08 DIAGNOSIS — J189 Pneumonia, unspecified organism: Secondary | ICD-10-CM | POA: Diagnosis not present

## 2022-03-08 DIAGNOSIS — R42 Dizziness and giddiness: Secondary | ICD-10-CM | POA: Insufficient documentation

## 2022-03-08 DIAGNOSIS — R0789 Other chest pain: Secondary | ICD-10-CM | POA: Diagnosis present

## 2022-03-08 LAB — CBC
HCT: 39.9 % (ref 36.0–46.0)
Hemoglobin: 12.9 g/dL (ref 12.0–15.0)
MCH: 29.6 pg (ref 26.0–34.0)
MCHC: 32.3 g/dL (ref 30.0–36.0)
MCV: 91.5 fL (ref 80.0–100.0)
Platelets: 284 10*3/uL (ref 150–400)
RBC: 4.36 MIL/uL (ref 3.87–5.11)
RDW: 13.3 % (ref 11.5–15.5)
WBC: 11.4 10*3/uL — ABNORMAL HIGH (ref 4.0–10.5)
nRBC: 0 % (ref 0.0–0.2)

## 2022-03-08 LAB — BASIC METABOLIC PANEL
Anion gap: 11 (ref 5–15)
BUN: 6 mg/dL (ref 6–20)
CO2: 20 mmol/L — ABNORMAL LOW (ref 22–32)
Calcium: 9 mg/dL (ref 8.9–10.3)
Chloride: 105 mmol/L (ref 98–111)
Creatinine, Ser: 0.69 mg/dL (ref 0.44–1.00)
GFR, Estimated: >60 mL/min (ref >60)
Glucose, Bld: 94 mg/dL (ref 70–99)
Potassium: 3.1 mmol/L — ABNORMAL LOW (ref 3.5–5.1)
Sodium: 136 mmol/L (ref 135–145)

## 2022-03-08 LAB — TROPONIN I (HIGH SENSITIVITY): Troponin I (High Sensitivity): 4 ng/L (ref <18)

## 2022-03-08 MED ORDER — ONDANSETRON 4 MG PO TBDP
4.0000 mg | ORAL_TABLET | Freq: Once | ORAL | Status: DC
  Filled 2022-03-08: qty 1

## 2022-03-08 MED ORDER — ONDANSETRON HCL 4 MG/2ML IJ SOLN
4.0000 mg | Freq: Once | INTRAMUSCULAR | Status: AC
  Administered 2022-03-08: 4 mg via INTRAVENOUS
  Filled 2022-03-08: qty 2

## 2022-03-08 NOTE — ED Triage Notes (Signed)
Pt presents via EMS c/o right sided chest pressure starting today. Reports difficulty breathing. A&Ox4.  ?

## 2022-03-09 ENCOUNTER — Emergency Department
Admission: EM | Admit: 2022-03-09 | Discharge: 2022-03-09 | Disposition: A | Discharge status: Home / Self Care | Payer: Medicaid Other | Attending: Emergency Medicine | Admitting: Emergency Medicine

## 2022-03-09 ENCOUNTER — Emergency Department: Payer: Medicaid Other

## 2022-03-09 DIAGNOSIS — J189 Pneumonia, unspecified organism: Secondary | ICD-10-CM

## 2022-03-09 DIAGNOSIS — R0789 Other chest pain: Secondary | ICD-10-CM

## 2022-03-09 LAB — RESP PANEL BY RT-PCR (FLU A&B, COVID) ARPGX2
Influenza A by PCR: NEGATIVE
Influenza B by PCR: NEGATIVE
SARS Coronavirus 2 by RT PCR: NEGATIVE

## 2022-03-09 LAB — TROPONIN I (HIGH SENSITIVITY): Troponin I (High Sensitivity): 3 ng/L (ref <18)

## 2022-03-09 MED ORDER — IOHEXOL 350 MG/ML SOLN
50.0000 mL | Freq: Once | INTRAVENOUS | Status: AC | PRN
  Administered 2022-03-09: 50 mL via INTRAVENOUS

## 2022-03-09 MED ORDER — AMOXICILLIN 500 MG PO CAPS: 1000.0000 mg | ORAL_CAPSULE | Freq: Three times a day (TID) | ORAL | 0 refills | Status: AC

## 2022-03-09 MED ORDER — AMOXICILLIN 500 MG PO CAPS
1000.0000 mg | ORAL_CAPSULE | Freq: Once | ORAL | Status: AC
  Administered 2022-03-09: 1000 mg via ORAL
  Filled 2022-03-09: qty 2

## 2022-03-09 NOTE — ED Provider Notes (Signed)
? ?[facility] ?Provider Note ? ? ? Event Date/Time  ? First MD Initiated Contact with Patient 03/09/22 0019   ?  (approximate) ? ? ?History  ? ?Chief Complaint ?Chest Pain ? ? ?HPI ? ?Anissa Abbs is a 36 y.o. female with past medical history of anxiety and depression who presents to the ED complaining of chest pain.  Patient reports that she was driving home from work earlier this evening when she had sudden onset of tightness and discomfort in the center and right side of her chest.  Pain has been present constantly since then, although has gradually eased up in severity.  It has been associated with shortness of breath, dizziness, and lightheadedness.  She reports similar episodes of symptoms in the past but never as severe as it was today.  She now states her breathing feels back to normal, denies any recent fevers or cough.  She has not noticed any pain or swelling in her legs, denies any history of DVT/PE. ?  ? ? ?Physical Exam  ? ?Triage Vital Signs: ?ED Triage Vitals [03/08/22 2200]  ?Enc Vitals Group  ?   BP 122/80  ?   Pulse Rate 82  ?   Resp (!) 32  ?   Temp 98.5 ?F (36.9 ?C)  ?   Temp Source Oral  ?   SpO2 94 %  ?   Weight   ?   Height   ?   Head Circumference   ?   Peak Flow   ?   Pain Score   ?   Pain Loc   ?   Pain Edu?   ?   Excl. in GC?   ? ? ?Most recent vital signs: ?Vitals:  ? 03/09/22 0151 03/09/22 0200  ?BP: (!) 122/55 (!) 143/88  ?Pulse: 65 63  ?Resp: (!) 21 (!) 21  ?Temp:    ?SpO2: 97% 97%  ? ? ?Constitutional: Alert and oriented. ?Eyes: Conjunctivae are normal. ?Head: Atraumatic. ?Nose: No congestion/rhinnorhea. ?Mouth/Throat: Mucous membranes are moist.  ?Cardiovascular: Normal rate, regular rhythm. Grossly normal heart sounds.  2+ radial pulses bilaterally. ?Respiratory: Normal respiratory effort.  No retractions. Lungs CTAB.  No chest wall tenderness to palpation noted. ?Gastrointestinal: Soft and nontender. No distention. ?Musculoskeletal: No lower extremity  tenderness nor edema.  ?Neurologic:  Normal speech and language. No gross focal neurologic deficits are appreciated. ? ? ? ?ED Results / Procedures / Treatments  ? ?Labs ?(all labs ordered are listed, but only abnormal results are displayed) ?Labs Reviewed  ?BASIC METABOLIC PANEL - Abnormal; Notable for the following components:  ?    Result Value  ? Potassium 3.1 (*)   ? CO2 20 (*)   ? All other components within normal limits  ?CBC - Abnormal; Notable for the following components:  ? WBC 11.4 (*)   ? All other components within normal limits  ?RESP PANEL BY RT-PCR (FLU A&B, COVID) [id number]  ?TROPONIN I (HIGH SENSITIVITY)  ?TROPONIN I (HIGH SENSITIVITY)  ? ? ? ?EKG ? ?ED ECG REPORT ?Harriet Masson, the attending physician, personally viewed and interpreted this ECG. ? ? Date: 03/09/2022 ? EKG Time: 21:54 ? Rate: 82 ? Rhythm: normal sinus rhythm ? Axis: RAD ? Intervals:none ? ST&T Change: None ? ?RADIOLOGY ?Chest x-ray reviewed by me with possible bilateral lower lobe infiltrates, no edema or effusion noted. ? ?PROCEDURES: ? ?Critical Care performed: No ? ?Procedures ? ? ?MEDICATIONS ORDERED IN ED: ?Medications  ?amoxicillin (AMOXIL) capsule 1,000  mg (has no administration in time range)  ?ondansetron [facility]) injection 4 mg (4 mg Intravenous Given 03/08/22 2325)  ?iohexol (OMNIPAQUE) 350 MG/ML injection 50 mL (50 mLs Intravenous Contrast Given 03/09/22 0122)  ? ? ? ?IMPRESSION / MDM / ASSESSMENT AND PLAN / ED COURSE  ?I reviewed the triage vital signs and the nursing notes. ?             ?               ? ?36 y.o. female with past medical history of anxiety and depression who presents to the ED complaining of chest pain and tightness starting suddenly on her way home from work this evening associated with shortness of breath that has since resolved. ? ?Differential diagnosis includes, but is not limited to, ACS, PE, pneumonia, pneumothorax, GERD, musculoskeletal pain, and anxiety. ? ?Patient is nontoxic-appearing  and in no acute distress, patient initially tachypneic on arrival to the ED, but this is resolved without intervention and remainder of vital signs are reassuring.  She states that difficulty breathing has resolved and she is maintaining O2 sats at 100% on room air.  EKG shows right axis deviation but is otherwise unremarkable with no evidence of arrhythmia or ischemia.  2 sets of troponin are negative and I doubt ACS, however with her right axis deviation and ongoing symptoms, we will need to rule out PE.  Chest x-ray shows possible bilateral lower lobe infiltrates and we will further assess with CTA of chest to rule out PE or pneumonia.  Remainder of labs are reassuring with CBC showing no anemia, BMP without AKI or electrolyte abnormality.  Patient states she is status post hysterectomy. ? ?CTA of chest is negative for PE, does show multifocal small infiltrates concerning for pneumonia.  Appearance is most consistent with viral etiology and we will send testing for COVID-19 and influenza, but plan to cover with antibiotics for now.  Given she remains well-appearing with no difficulty breathing at this time, she is appropriate for outpatient management.  We will treat with amoxicillin and she was counseled to follow-up with her PCP, otherwise return to the ED for new worsening symptoms.  Patient and mother agree with plan. ? ?  ? ? ?FINAL CLINICAL IMPRESSION(S) / ED DIAGNOSES  ? ?Final diagnoses:  ?Atypical chest pain  ?Multifocal pneumonia  ? ? ? ?Rx / DC Orders  ? ?ED Discharge Orders   ? ?      Ordered  ?  amoxicillin (AMOXIL) 500 MG capsule  3 times daily       ? 03/09/22 0211  ? ?  ?  ? ?  ? ? ? ?Note:  This document was prepared using Dragon voice recognition software and may include unintentional dictation errors. ?  ?Chesley Noon, MD ?03/09/22 [phone] ? ?
